# Patient Record
Sex: Female | Born: 1950
Health system: Southern US, Community
[De-identification: ages and names within clinical notes are randomized; demographics above are authoritative.]

## PROBLEM LIST (undated history)

## (undated) DIAGNOSIS — E7800 Pure hypercholesterolemia, unspecified: Secondary | ICD-10-CM

## (undated) DIAGNOSIS — E119 Type 2 diabetes mellitus without complications: Secondary | ICD-10-CM

---

## 1998-03-23 ENCOUNTER — Ambulatory Visit (HOSPITAL_COMMUNITY): Admission: RE | Admit: 1998-03-23 | Discharge: 1998-03-23 | Payer: Self-pay | Admitting: *Deleted

## 1998-06-28 ENCOUNTER — Ambulatory Visit (HOSPITAL_COMMUNITY): Admission: RE | Admit: 1998-06-28 | Discharge: 1998-06-28 | Payer: Self-pay | Admitting: Gastroenterology

## 1999-04-20 ENCOUNTER — Other Ambulatory Visit: Admission: RE | Admit: 1999-04-20 | Discharge: 1999-04-20 | Payer: Self-pay | Admitting: *Deleted

## 1999-06-02 ENCOUNTER — Ambulatory Visit (HOSPITAL_COMMUNITY): Admission: RE | Admit: 1999-06-02 | Discharge: 1999-06-02 | Payer: Self-pay | Admitting: *Deleted

## 1999-06-30 ENCOUNTER — Ambulatory Visit (HOSPITAL_COMMUNITY): Admission: RE | Admit: 1999-06-30 | Discharge: 1999-06-30 | Payer: Self-pay | Admitting: *Deleted

## 2000-10-31 ENCOUNTER — Encounter: Payer: Self-pay | Admitting: Internal Medicine

## 2000-10-31 ENCOUNTER — Ambulatory Visit (HOSPITAL_COMMUNITY): Admission: RE | Admit: 2000-10-31 | Discharge: 2000-10-31 | Payer: Self-pay | Admitting: Internal Medicine

## 2000-11-12 ENCOUNTER — Encounter: Admission: RE | Admit: 2000-11-12 | Discharge: 2001-02-10 | Payer: Self-pay | Admitting: Endocrinology

## 2000-11-22 ENCOUNTER — Ambulatory Visit (HOSPITAL_COMMUNITY): Admission: RE | Admit: 2000-11-22 | Discharge: 2000-11-22 | Payer: Self-pay | Admitting: Internal Medicine

## 2000-11-22 ENCOUNTER — Encounter: Payer: Self-pay | Admitting: Internal Medicine

## 2001-03-20 ENCOUNTER — Other Ambulatory Visit: Admission: RE | Admit: 2001-03-20 | Discharge: 2001-03-20 | Payer: Self-pay | Admitting: Internal Medicine

## 2002-12-31 ENCOUNTER — Ambulatory Visit (HOSPITAL_COMMUNITY): Admission: RE | Admit: 2002-12-31 | Discharge: 2002-12-31 | Payer: Self-pay | Admitting: Internal Medicine

## 2002-12-31 ENCOUNTER — Encounter: Payer: Self-pay | Admitting: Internal Medicine

## 2003-11-23 ENCOUNTER — Other Ambulatory Visit: Admission: RE | Admit: 2003-11-23 | Discharge: 2003-11-23 | Payer: Self-pay | Admitting: Obstetrics and Gynecology

## 2004-02-24 ENCOUNTER — Ambulatory Visit (HOSPITAL_COMMUNITY): Admission: RE | Admit: 2004-02-24 | Discharge: 2004-02-24 | Payer: Self-pay | Admitting: Obstetrics and Gynecology

## 2004-12-08 ENCOUNTER — Other Ambulatory Visit: Admission: RE | Admit: 2004-12-08 | Discharge: 2004-12-08 | Payer: Self-pay | Admitting: Obstetrics and Gynecology

## 2013-08-12 ENCOUNTER — Encounter (INDEPENDENT_AMBULATORY_CARE_PROVIDER_SITE_OTHER): Payer: Self-pay | Admitting: Ophthalmology

## 2013-08-19 ENCOUNTER — Encounter (INDEPENDENT_AMBULATORY_CARE_PROVIDER_SITE_OTHER): Payer: Self-pay | Admitting: Ophthalmology

## 2013-08-31 ENCOUNTER — Encounter (INDEPENDENT_AMBULATORY_CARE_PROVIDER_SITE_OTHER): Payer: BC Managed Care – PPO | Admitting: Ophthalmology

## 2013-08-31 DIAGNOSIS — E1165 Type 2 diabetes mellitus with hyperglycemia: Secondary | ICD-10-CM

## 2013-08-31 DIAGNOSIS — E1139 Type 2 diabetes mellitus with other diabetic ophthalmic complication: Secondary | ICD-10-CM

## 2013-08-31 DIAGNOSIS — E11319 Type 2 diabetes mellitus with unspecified diabetic retinopathy without macular edema: Secondary | ICD-10-CM

## 2013-08-31 DIAGNOSIS — H2510 Age-related nuclear cataract, unspecified eye: Secondary | ICD-10-CM

## 2013-08-31 DIAGNOSIS — H43819 Vitreous degeneration, unspecified eye: Secondary | ICD-10-CM

## 2013-09-14 ENCOUNTER — Other Ambulatory Visit (INDEPENDENT_AMBULATORY_CARE_PROVIDER_SITE_OTHER): Payer: BC Managed Care – PPO | Admitting: Ophthalmology

## 2013-09-14 DIAGNOSIS — E1139 Type 2 diabetes mellitus with other diabetic ophthalmic complication: Secondary | ICD-10-CM

## 2013-09-14 DIAGNOSIS — E1165 Type 2 diabetes mellitus with hyperglycemia: Secondary | ICD-10-CM

## 2013-09-14 DIAGNOSIS — H3581 Retinal edema: Secondary | ICD-10-CM

## 2014-01-12 ENCOUNTER — Ambulatory Visit (INDEPENDENT_AMBULATORY_CARE_PROVIDER_SITE_OTHER): Payer: BC Managed Care – PPO | Admitting: Ophthalmology

## 2014-02-09 DIAGNOSIS — E119 Type 2 diabetes mellitus without complications: Secondary | ICD-10-CM | POA: Diagnosis not present

## 2014-02-17 DIAGNOSIS — E789 Disorder of lipoprotein metabolism, unspecified: Secondary | ICD-10-CM | POA: Diagnosis not present

## 2014-02-17 DIAGNOSIS — I10 Essential (primary) hypertension: Secondary | ICD-10-CM | POA: Diagnosis not present

## 2014-02-17 DIAGNOSIS — E119 Type 2 diabetes mellitus without complications: Secondary | ICD-10-CM | POA: Diagnosis not present

## 2014-05-11 DIAGNOSIS — Z23 Encounter for immunization: Secondary | ICD-10-CM | POA: Diagnosis not present

## 2014-06-16 DIAGNOSIS — E789 Disorder of lipoprotein metabolism, unspecified: Secondary | ICD-10-CM | POA: Diagnosis not present

## 2014-06-16 DIAGNOSIS — I10 Essential (primary) hypertension: Secondary | ICD-10-CM | POA: Diagnosis not present

## 2014-06-16 DIAGNOSIS — Z79899 Other long term (current) drug therapy: Secondary | ICD-10-CM | POA: Diagnosis not present

## 2014-06-16 DIAGNOSIS — E118 Type 2 diabetes mellitus with unspecified complications: Secondary | ICD-10-CM | POA: Diagnosis not present

## 2014-06-17 DIAGNOSIS — E785 Hyperlipidemia, unspecified: Secondary | ICD-10-CM | POA: Diagnosis not present

## 2014-06-21 DIAGNOSIS — E118 Type 2 diabetes mellitus with unspecified complications: Secondary | ICD-10-CM | POA: Diagnosis not present

## 2014-06-21 DIAGNOSIS — I10 Essential (primary) hypertension: Secondary | ICD-10-CM | POA: Diagnosis not present

## 2014-07-05 DIAGNOSIS — L259 Unspecified contact dermatitis, unspecified cause: Secondary | ICD-10-CM | POA: Diagnosis not present

## 2014-10-12 DIAGNOSIS — E118 Type 2 diabetes mellitus with unspecified complications: Secondary | ICD-10-CM | POA: Diagnosis not present

## 2014-10-12 DIAGNOSIS — I10 Essential (primary) hypertension: Secondary | ICD-10-CM | POA: Diagnosis not present

## 2014-10-14 DIAGNOSIS — I10 Essential (primary) hypertension: Secondary | ICD-10-CM | POA: Diagnosis not present

## 2014-10-14 DIAGNOSIS — E789 Disorder of lipoprotein metabolism, unspecified: Secondary | ICD-10-CM | POA: Diagnosis not present

## 2014-10-14 DIAGNOSIS — E118 Type 2 diabetes mellitus with unspecified complications: Secondary | ICD-10-CM | POA: Diagnosis not present

## 2014-10-20 DIAGNOSIS — T50905A Adverse effect of unspecified drugs, medicaments and biological substances, initial encounter: Secondary | ICD-10-CM | POA: Diagnosis not present

## 2014-10-20 DIAGNOSIS — I10 Essential (primary) hypertension: Secondary | ICD-10-CM | POA: Diagnosis not present

## 2014-10-20 DIAGNOSIS — E118 Type 2 diabetes mellitus with unspecified complications: Secondary | ICD-10-CM | POA: Diagnosis not present

## 2014-10-20 DIAGNOSIS — L309 Dermatitis, unspecified: Secondary | ICD-10-CM | POA: Diagnosis not present

## 2014-11-16 DIAGNOSIS — J329 Chronic sinusitis, unspecified: Secondary | ICD-10-CM | POA: Diagnosis not present

## 2014-11-16 DIAGNOSIS — E789 Disorder of lipoprotein metabolism, unspecified: Secondary | ICD-10-CM | POA: Diagnosis not present

## 2014-11-16 DIAGNOSIS — J069 Acute upper respiratory infection, unspecified: Secondary | ICD-10-CM | POA: Diagnosis not present

## 2014-11-16 DIAGNOSIS — E118 Type 2 diabetes mellitus with unspecified complications: Secondary | ICD-10-CM | POA: Diagnosis not present

## 2014-12-14 ENCOUNTER — Encounter (INDEPENDENT_AMBULATORY_CARE_PROVIDER_SITE_OTHER): Payer: Medicare Other | Admitting: Ophthalmology

## 2014-12-14 DIAGNOSIS — H2513 Age-related nuclear cataract, bilateral: Secondary | ICD-10-CM | POA: Diagnosis not present

## 2014-12-14 DIAGNOSIS — H43813 Vitreous degeneration, bilateral: Secondary | ICD-10-CM

## 2014-12-14 DIAGNOSIS — E11359 Type 2 diabetes mellitus with proliferative diabetic retinopathy without macular edema: Secondary | ICD-10-CM

## 2014-12-14 DIAGNOSIS — E11311 Type 2 diabetes mellitus with unspecified diabetic retinopathy with macular edema: Secondary | ICD-10-CM

## 2014-12-14 DIAGNOSIS — E11351 Type 2 diabetes mellitus with proliferative diabetic retinopathy with macular edema: Secondary | ICD-10-CM | POA: Diagnosis not present

## 2015-03-01 DIAGNOSIS — E789 Disorder of lipoprotein metabolism, unspecified: Secondary | ICD-10-CM | POA: Diagnosis not present

## 2015-03-01 DIAGNOSIS — E118 Type 2 diabetes mellitus with unspecified complications: Secondary | ICD-10-CM | POA: Diagnosis not present

## 2015-03-09 DIAGNOSIS — E032 Hypothyroidism due to medicaments and other exogenous substances: Secondary | ICD-10-CM | POA: Diagnosis not present

## 2015-03-09 DIAGNOSIS — E118 Type 2 diabetes mellitus with unspecified complications: Secondary | ICD-10-CM | POA: Diagnosis not present

## 2015-03-09 DIAGNOSIS — I10 Essential (primary) hypertension: Secondary | ICD-10-CM | POA: Diagnosis not present

## 2015-03-09 DIAGNOSIS — M79643 Pain in unspecified hand: Secondary | ICD-10-CM | POA: Diagnosis not present

## 2015-04-04 DIAGNOSIS — Z23 Encounter for immunization: Secondary | ICD-10-CM | POA: Diagnosis not present

## 2015-06-17 ENCOUNTER — Ambulatory Visit (INDEPENDENT_AMBULATORY_CARE_PROVIDER_SITE_OTHER): Payer: Medicare Other | Admitting: Ophthalmology

## 2015-06-24 ENCOUNTER — Ambulatory Visit (INDEPENDENT_AMBULATORY_CARE_PROVIDER_SITE_OTHER): Payer: Medicare Other | Admitting: Ophthalmology

## 2015-06-24 DIAGNOSIS — H43813 Vitreous degeneration, bilateral: Secondary | ICD-10-CM

## 2015-06-24 DIAGNOSIS — H2513 Age-related nuclear cataract, bilateral: Secondary | ICD-10-CM | POA: Diagnosis not present

## 2015-06-24 DIAGNOSIS — E11319 Type 2 diabetes mellitus with unspecified diabetic retinopathy without macular edema: Secondary | ICD-10-CM | POA: Diagnosis not present

## 2015-06-24 DIAGNOSIS — E113593 Type 2 diabetes mellitus with proliferative diabetic retinopathy without macular edema, bilateral: Secondary | ICD-10-CM | POA: Diagnosis not present

## 2015-07-12 DIAGNOSIS — E118 Type 2 diabetes mellitus with unspecified complications: Secondary | ICD-10-CM | POA: Diagnosis not present

## 2015-07-12 DIAGNOSIS — E032 Hypothyroidism due to medicaments and other exogenous substances: Secondary | ICD-10-CM | POA: Diagnosis not present

## 2015-07-12 DIAGNOSIS — E7800 Pure hypercholesterolemia, unspecified: Secondary | ICD-10-CM | POA: Diagnosis not present

## 2015-07-25 DIAGNOSIS — M79674 Pain in right toe(s): Secondary | ICD-10-CM | POA: Diagnosis not present

## 2015-07-25 DIAGNOSIS — E118 Type 2 diabetes mellitus with unspecified complications: Secondary | ICD-10-CM | POA: Diagnosis not present

## 2015-07-25 DIAGNOSIS — I10 Essential (primary) hypertension: Secondary | ICD-10-CM | POA: Diagnosis not present

## 2015-07-25 DIAGNOSIS — E789 Disorder of lipoprotein metabolism, unspecified: Secondary | ICD-10-CM | POA: Diagnosis not present

## 2015-07-25 DIAGNOSIS — Z79899 Other long term (current) drug therapy: Secondary | ICD-10-CM | POA: Diagnosis not present

## 2015-07-25 DIAGNOSIS — M79676 Pain in unspecified toe(s): Secondary | ICD-10-CM | POA: Diagnosis not present

## 2015-10-10 DIAGNOSIS — R05 Cough: Secondary | ICD-10-CM | POA: Diagnosis not present

## 2015-10-10 DIAGNOSIS — J111 Influenza due to unidentified influenza virus with other respiratory manifestations: Secondary | ICD-10-CM | POA: Diagnosis not present

## 2015-11-15 DIAGNOSIS — E118 Type 2 diabetes mellitus with unspecified complications: Secondary | ICD-10-CM | POA: Diagnosis not present

## 2015-11-15 DIAGNOSIS — I10 Essential (primary) hypertension: Secondary | ICD-10-CM | POA: Diagnosis not present

## 2015-11-23 DIAGNOSIS — E118 Type 2 diabetes mellitus with unspecified complications: Secondary | ICD-10-CM | POA: Diagnosis not present

## 2015-12-22 ENCOUNTER — Ambulatory Visit (INDEPENDENT_AMBULATORY_CARE_PROVIDER_SITE_OTHER): Payer: Medicare Other | Admitting: Ophthalmology

## 2016-02-10 DIAGNOSIS — E118 Type 2 diabetes mellitus with unspecified complications: Secondary | ICD-10-CM | POA: Diagnosis not present

## 2016-02-10 DIAGNOSIS — I10 Essential (primary) hypertension: Secondary | ICD-10-CM | POA: Diagnosis not present

## 2016-02-15 ENCOUNTER — Ambulatory Visit (INDEPENDENT_AMBULATORY_CARE_PROVIDER_SITE_OTHER): Payer: Medicare Other | Admitting: Ophthalmology

## 2016-02-15 DIAGNOSIS — H2513 Age-related nuclear cataract, bilateral: Secondary | ICD-10-CM

## 2016-02-15 DIAGNOSIS — E113593 Type 2 diabetes mellitus with proliferative diabetic retinopathy without macular edema, bilateral: Secondary | ICD-10-CM

## 2016-02-15 DIAGNOSIS — H43813 Vitreous degeneration, bilateral: Secondary | ICD-10-CM | POA: Diagnosis not present

## 2016-02-15 DIAGNOSIS — E11319 Type 2 diabetes mellitus with unspecified diabetic retinopathy without macular edema: Secondary | ICD-10-CM

## 2016-02-20 DIAGNOSIS — M79675 Pain in left toe(s): Secondary | ICD-10-CM | POA: Diagnosis not present

## 2016-02-20 DIAGNOSIS — S92912A Unspecified fracture of left toe(s), initial encounter for closed fracture: Secondary | ICD-10-CM | POA: Diagnosis not present

## 2016-03-01 DIAGNOSIS — H8113 Benign paroxysmal vertigo, bilateral: Secondary | ICD-10-CM | POA: Diagnosis not present

## 2016-03-30 ENCOUNTER — Other Ambulatory Visit: Payer: Self-pay

## 2016-04-10 DIAGNOSIS — E118 Type 2 diabetes mellitus with unspecified complications: Secondary | ICD-10-CM | POA: Diagnosis not present

## 2016-04-10 DIAGNOSIS — Z23 Encounter for immunization: Secondary | ICD-10-CM | POA: Diagnosis not present

## 2016-04-10 DIAGNOSIS — E032 Hypothyroidism due to medicaments and other exogenous substances: Secondary | ICD-10-CM | POA: Diagnosis not present

## 2016-07-10 DIAGNOSIS — E118 Type 2 diabetes mellitus with unspecified complications: Secondary | ICD-10-CM | POA: Diagnosis not present

## 2016-07-10 DIAGNOSIS — E032 Hypothyroidism due to medicaments and other exogenous substances: Secondary | ICD-10-CM | POA: Diagnosis not present

## 2016-07-10 DIAGNOSIS — I10 Essential (primary) hypertension: Secondary | ICD-10-CM | POA: Diagnosis not present

## 2016-07-17 DIAGNOSIS — I10 Essential (primary) hypertension: Secondary | ICD-10-CM | POA: Diagnosis not present

## 2016-07-17 DIAGNOSIS — N390 Urinary tract infection, site not specified: Secondary | ICD-10-CM | POA: Diagnosis not present

## 2016-07-17 DIAGNOSIS — E118 Type 2 diabetes mellitus with unspecified complications: Secondary | ICD-10-CM | POA: Diagnosis not present

## 2016-07-17 DIAGNOSIS — J329 Chronic sinusitis, unspecified: Secondary | ICD-10-CM | POA: Diagnosis not present

## 2016-08-22 ENCOUNTER — Ambulatory Visit (INDEPENDENT_AMBULATORY_CARE_PROVIDER_SITE_OTHER): Payer: Medicare Other | Admitting: Ophthalmology

## 2016-09-17 ENCOUNTER — Ambulatory Visit (INDEPENDENT_AMBULATORY_CARE_PROVIDER_SITE_OTHER): Payer: Medicare Other | Admitting: Ophthalmology

## 2016-09-17 DIAGNOSIS — E11319 Type 2 diabetes mellitus with unspecified diabetic retinopathy without macular edema: Secondary | ICD-10-CM | POA: Diagnosis not present

## 2016-09-17 DIAGNOSIS — E113593 Type 2 diabetes mellitus with proliferative diabetic retinopathy without macular edema, bilateral: Secondary | ICD-10-CM | POA: Diagnosis not present

## 2016-09-17 DIAGNOSIS — H2513 Age-related nuclear cataract, bilateral: Secondary | ICD-10-CM | POA: Diagnosis not present

## 2016-09-17 DIAGNOSIS — H43813 Vitreous degeneration, bilateral: Secondary | ICD-10-CM | POA: Diagnosis not present

## 2016-10-09 DIAGNOSIS — I10 Essential (primary) hypertension: Secondary | ICD-10-CM | POA: Diagnosis not present

## 2016-10-09 DIAGNOSIS — E789 Disorder of lipoprotein metabolism, unspecified: Secondary | ICD-10-CM | POA: Diagnosis not present

## 2016-10-09 DIAGNOSIS — E118 Type 2 diabetes mellitus with unspecified complications: Secondary | ICD-10-CM | POA: Diagnosis not present

## 2016-10-16 DIAGNOSIS — I10 Essential (primary) hypertension: Secondary | ICD-10-CM | POA: Diagnosis not present

## 2016-10-16 DIAGNOSIS — E118 Type 2 diabetes mellitus with unspecified complications: Secondary | ICD-10-CM | POA: Diagnosis not present

## 2016-11-29 DIAGNOSIS — H31013 Macula scars of posterior pole (postinflammatory) (post-traumatic), bilateral: Secondary | ICD-10-CM | POA: Diagnosis not present

## 2016-11-29 DIAGNOSIS — E113593 Type 2 diabetes mellitus with proliferative diabetic retinopathy without macular edema, bilateral: Secondary | ICD-10-CM | POA: Diagnosis not present

## 2016-11-29 DIAGNOSIS — H2513 Age-related nuclear cataract, bilateral: Secondary | ICD-10-CM | POA: Diagnosis not present

## 2016-11-29 DIAGNOSIS — H2512 Age-related nuclear cataract, left eye: Secondary | ICD-10-CM | POA: Diagnosis not present

## 2016-11-29 DIAGNOSIS — H25013 Cortical age-related cataract, bilateral: Secondary | ICD-10-CM | POA: Diagnosis not present

## 2017-01-01 DIAGNOSIS — H25012 Cortical age-related cataract, left eye: Secondary | ICD-10-CM | POA: Diagnosis not present

## 2017-01-01 DIAGNOSIS — H2512 Age-related nuclear cataract, left eye: Secondary | ICD-10-CM | POA: Diagnosis not present

## 2017-01-01 DIAGNOSIS — H25032 Anterior subcapsular polar age-related cataract, left eye: Secondary | ICD-10-CM | POA: Diagnosis not present

## 2017-01-10 DIAGNOSIS — E789 Disorder of lipoprotein metabolism, unspecified: Secondary | ICD-10-CM | POA: Diagnosis not present

## 2017-01-10 DIAGNOSIS — E032 Hypothyroidism due to medicaments and other exogenous substances: Secondary | ICD-10-CM | POA: Diagnosis not present

## 2017-01-10 DIAGNOSIS — Z79899 Other long term (current) drug therapy: Secondary | ICD-10-CM | POA: Diagnosis not present

## 2017-01-10 DIAGNOSIS — E118 Type 2 diabetes mellitus with unspecified complications: Secondary | ICD-10-CM | POA: Diagnosis not present

## 2017-01-10 DIAGNOSIS — I10 Essential (primary) hypertension: Secondary | ICD-10-CM | POA: Diagnosis not present

## 2017-01-17 DIAGNOSIS — E789 Disorder of lipoprotein metabolism, unspecified: Secondary | ICD-10-CM | POA: Diagnosis not present

## 2017-01-17 DIAGNOSIS — E118 Type 2 diabetes mellitus with unspecified complications: Secondary | ICD-10-CM | POA: Diagnosis not present

## 2017-01-17 DIAGNOSIS — I10 Essential (primary) hypertension: Secondary | ICD-10-CM | POA: Diagnosis not present

## 2017-01-17 DIAGNOSIS — M1990 Unspecified osteoarthritis, unspecified site: Secondary | ICD-10-CM | POA: Diagnosis not present

## 2017-01-23 DIAGNOSIS — H2511 Age-related nuclear cataract, right eye: Secondary | ICD-10-CM | POA: Diagnosis not present

## 2017-01-23 DIAGNOSIS — H25011 Cortical age-related cataract, right eye: Secondary | ICD-10-CM | POA: Diagnosis not present

## 2017-01-23 DIAGNOSIS — H25041 Posterior subcapsular polar age-related cataract, right eye: Secondary | ICD-10-CM | POA: Diagnosis not present

## 2017-03-21 ENCOUNTER — Ambulatory Visit (INDEPENDENT_AMBULATORY_CARE_PROVIDER_SITE_OTHER): Payer: Medicare Other | Admitting: Ophthalmology

## 2017-03-21 DIAGNOSIS — E11319 Type 2 diabetes mellitus with unspecified diabetic retinopathy without macular edema: Secondary | ICD-10-CM | POA: Diagnosis not present

## 2017-03-21 DIAGNOSIS — H2511 Age-related nuclear cataract, right eye: Secondary | ICD-10-CM

## 2017-03-21 DIAGNOSIS — E113593 Type 2 diabetes mellitus with proliferative diabetic retinopathy without macular edema, bilateral: Secondary | ICD-10-CM | POA: Diagnosis not present

## 2017-03-21 DIAGNOSIS — H43813 Vitreous degeneration, bilateral: Secondary | ICD-10-CM | POA: Diagnosis not present

## 2017-04-16 DIAGNOSIS — H25811 Combined forms of age-related cataract, right eye: Secondary | ICD-10-CM | POA: Diagnosis not present

## 2017-04-16 DIAGNOSIS — H2511 Age-related nuclear cataract, right eye: Secondary | ICD-10-CM | POA: Diagnosis not present

## 2017-05-10 DIAGNOSIS — E118 Type 2 diabetes mellitus with unspecified complications: Secondary | ICD-10-CM | POA: Diagnosis not present

## 2017-05-20 DIAGNOSIS — Z23 Encounter for immunization: Secondary | ICD-10-CM | POA: Diagnosis not present

## 2017-05-20 DIAGNOSIS — E118 Type 2 diabetes mellitus with unspecified complications: Secondary | ICD-10-CM | POA: Diagnosis not present

## 2017-05-20 DIAGNOSIS — E11319 Type 2 diabetes mellitus with unspecified diabetic retinopathy without macular edema: Secondary | ICD-10-CM | POA: Diagnosis not present

## 2017-05-20 DIAGNOSIS — E789 Disorder of lipoprotein metabolism, unspecified: Secondary | ICD-10-CM | POA: Diagnosis not present

## 2017-05-20 DIAGNOSIS — I10 Essential (primary) hypertension: Secondary | ICD-10-CM | POA: Diagnosis not present

## 2017-07-31 DIAGNOSIS — Z0000 Encounter for general adult medical examination without abnormal findings: Secondary | ICD-10-CM | POA: Diagnosis not present

## 2017-08-21 DIAGNOSIS — E789 Disorder of lipoprotein metabolism, unspecified: Secondary | ICD-10-CM | POA: Diagnosis not present

## 2017-08-21 DIAGNOSIS — I10 Essential (primary) hypertension: Secondary | ICD-10-CM | POA: Diagnosis not present

## 2017-08-21 DIAGNOSIS — E118 Type 2 diabetes mellitus with unspecified complications: Secondary | ICD-10-CM | POA: Diagnosis not present

## 2017-08-26 DIAGNOSIS — E782 Mixed hyperlipidemia: Secondary | ICD-10-CM | POA: Diagnosis not present

## 2017-08-26 DIAGNOSIS — I10 Essential (primary) hypertension: Secondary | ICD-10-CM | POA: Diagnosis not present

## 2017-08-26 DIAGNOSIS — E118 Type 2 diabetes mellitus with unspecified complications: Secondary | ICD-10-CM | POA: Diagnosis not present

## 2017-08-26 DIAGNOSIS — E11319 Type 2 diabetes mellitus with unspecified diabetic retinopathy without macular edema: Secondary | ICD-10-CM | POA: Diagnosis not present

## 2017-09-17 DIAGNOSIS — Z1211 Encounter for screening for malignant neoplasm of colon: Secondary | ICD-10-CM | POA: Diagnosis not present

## 2017-09-17 DIAGNOSIS — Z1212 Encounter for screening for malignant neoplasm of rectum: Secondary | ICD-10-CM | POA: Diagnosis not present

## 2017-09-19 ENCOUNTER — Encounter (INDEPENDENT_AMBULATORY_CARE_PROVIDER_SITE_OTHER): Payer: Medicare Other | Admitting: Ophthalmology

## 2018-02-24 DIAGNOSIS — N390 Urinary tract infection, site not specified: Secondary | ICD-10-CM | POA: Diagnosis not present

## 2018-02-24 DIAGNOSIS — E782 Mixed hyperlipidemia: Secondary | ICD-10-CM | POA: Diagnosis not present

## 2018-02-24 DIAGNOSIS — E118 Type 2 diabetes mellitus with unspecified complications: Secondary | ICD-10-CM | POA: Diagnosis not present

## 2018-02-24 DIAGNOSIS — Z23 Encounter for immunization: Secondary | ICD-10-CM | POA: Diagnosis not present

## 2018-02-24 DIAGNOSIS — Z0000 Encounter for general adult medical examination without abnormal findings: Secondary | ICD-10-CM | POA: Diagnosis not present

## 2018-02-24 DIAGNOSIS — I10 Essential (primary) hypertension: Secondary | ICD-10-CM | POA: Diagnosis not present

## 2018-03-03 DIAGNOSIS — E782 Mixed hyperlipidemia: Secondary | ICD-10-CM | POA: Diagnosis not present

## 2018-03-03 DIAGNOSIS — I10 Essential (primary) hypertension: Secondary | ICD-10-CM | POA: Diagnosis not present

## 2018-03-03 DIAGNOSIS — E11319 Type 2 diabetes mellitus with unspecified diabetic retinopathy without macular edema: Secondary | ICD-10-CM | POA: Diagnosis not present

## 2018-03-03 DIAGNOSIS — E118 Type 2 diabetes mellitus with unspecified complications: Secondary | ICD-10-CM | POA: Diagnosis not present

## 2018-04-02 DIAGNOSIS — Z23 Encounter for immunization: Secondary | ICD-10-CM | POA: Diagnosis not present

## 2018-06-02 DIAGNOSIS — E118 Type 2 diabetes mellitus with unspecified complications: Secondary | ICD-10-CM | POA: Diagnosis not present

## 2018-06-02 DIAGNOSIS — E782 Mixed hyperlipidemia: Secondary | ICD-10-CM | POA: Diagnosis not present

## 2018-06-09 DIAGNOSIS — E782 Mixed hyperlipidemia: Secondary | ICD-10-CM | POA: Diagnosis not present

## 2018-06-09 DIAGNOSIS — E11319 Type 2 diabetes mellitus with unspecified diabetic retinopathy without macular edema: Secondary | ICD-10-CM | POA: Diagnosis not present

## 2018-06-09 DIAGNOSIS — I10 Essential (primary) hypertension: Secondary | ICD-10-CM | POA: Diagnosis not present

## 2018-06-09 DIAGNOSIS — E118 Type 2 diabetes mellitus with unspecified complications: Secondary | ICD-10-CM | POA: Diagnosis not present

## 2018-09-03 DIAGNOSIS — E118 Type 2 diabetes mellitus with unspecified complications: Secondary | ICD-10-CM | POA: Diagnosis not present

## 2018-09-03 DIAGNOSIS — E782 Mixed hyperlipidemia: Secondary | ICD-10-CM | POA: Diagnosis not present

## 2018-09-08 DIAGNOSIS — J0110 Acute frontal sinusitis, unspecified: Secondary | ICD-10-CM | POA: Diagnosis not present

## 2018-09-08 DIAGNOSIS — E782 Mixed hyperlipidemia: Secondary | ICD-10-CM | POA: Diagnosis not present

## 2018-09-08 DIAGNOSIS — E118 Type 2 diabetes mellitus with unspecified complications: Secondary | ICD-10-CM | POA: Diagnosis not present

## 2018-09-08 DIAGNOSIS — J0100 Acute maxillary sinusitis, unspecified: Secondary | ICD-10-CM | POA: Diagnosis not present

## 2018-09-08 DIAGNOSIS — I10 Essential (primary) hypertension: Secondary | ICD-10-CM | POA: Diagnosis not present

## 2018-09-22 DIAGNOSIS — E11319 Type 2 diabetes mellitus with unspecified diabetic retinopathy without macular edema: Secondary | ICD-10-CM | POA: Diagnosis not present

## 2018-09-22 DIAGNOSIS — I10 Essential (primary) hypertension: Secondary | ICD-10-CM | POA: Diagnosis not present

## 2018-09-22 DIAGNOSIS — E782 Mixed hyperlipidemia: Secondary | ICD-10-CM | POA: Diagnosis not present

## 2018-09-22 DIAGNOSIS — J0100 Acute maxillary sinusitis, unspecified: Secondary | ICD-10-CM | POA: Diagnosis not present

## 2018-09-22 DIAGNOSIS — E118 Type 2 diabetes mellitus with unspecified complications: Secondary | ICD-10-CM | POA: Diagnosis not present

## 2018-10-07 DIAGNOSIS — E118 Type 2 diabetes mellitus with unspecified complications: Secondary | ICD-10-CM | POA: Diagnosis not present

## 2018-10-07 DIAGNOSIS — I10 Essential (primary) hypertension: Secondary | ICD-10-CM | POA: Diagnosis not present

## 2018-10-15 DIAGNOSIS — S0181XA Laceration without foreign body of other part of head, initial encounter: Secondary | ICD-10-CM | POA: Diagnosis not present

## 2018-10-15 DIAGNOSIS — S00531A Contusion of lip, initial encounter: Secondary | ICD-10-CM | POA: Diagnosis not present

## 2018-10-15 DIAGNOSIS — T148XXA Other injury of unspecified body region, initial encounter: Secondary | ICD-10-CM | POA: Diagnosis not present

## 2018-10-28 DIAGNOSIS — E118 Type 2 diabetes mellitus with unspecified complications: Secondary | ICD-10-CM | POA: Diagnosis not present

## 2019-01-12 DIAGNOSIS — I10 Essential (primary) hypertension: Secondary | ICD-10-CM | POA: Diagnosis not present

## 2019-01-12 DIAGNOSIS — Z0000 Encounter for general adult medical examination without abnormal findings: Secondary | ICD-10-CM | POA: Diagnosis not present

## 2019-01-12 DIAGNOSIS — E11319 Type 2 diabetes mellitus with unspecified diabetic retinopathy without macular edema: Secondary | ICD-10-CM | POA: Diagnosis not present

## 2019-01-12 DIAGNOSIS — E782 Mixed hyperlipidemia: Secondary | ICD-10-CM | POA: Diagnosis not present

## 2019-01-12 DIAGNOSIS — E118 Type 2 diabetes mellitus with unspecified complications: Secondary | ICD-10-CM | POA: Diagnosis not present

## 2019-03-09 DIAGNOSIS — E118 Type 2 diabetes mellitus with unspecified complications: Secondary | ICD-10-CM | POA: Diagnosis not present

## 2019-03-09 DIAGNOSIS — D509 Iron deficiency anemia, unspecified: Secondary | ICD-10-CM | POA: Diagnosis not present

## 2019-03-09 DIAGNOSIS — Z0000 Encounter for general adult medical examination without abnormal findings: Secondary | ICD-10-CM | POA: Diagnosis not present

## 2019-03-09 DIAGNOSIS — Z7189 Other specified counseling: Secondary | ICD-10-CM | POA: Diagnosis not present

## 2019-03-09 DIAGNOSIS — E782 Mixed hyperlipidemia: Secondary | ICD-10-CM | POA: Diagnosis not present

## 2019-03-09 DIAGNOSIS — I10 Essential (primary) hypertension: Secondary | ICD-10-CM | POA: Diagnosis not present

## 2019-03-09 DIAGNOSIS — Z23 Encounter for immunization: Secondary | ICD-10-CM | POA: Diagnosis not present

## 2019-03-16 DIAGNOSIS — Z794 Long term (current) use of insulin: Secondary | ICD-10-CM | POA: Diagnosis not present

## 2019-03-16 DIAGNOSIS — E11319 Type 2 diabetes mellitus with unspecified diabetic retinopathy without macular edema: Secondary | ICD-10-CM | POA: Diagnosis not present

## 2019-03-16 DIAGNOSIS — I10 Essential (primary) hypertension: Secondary | ICD-10-CM | POA: Diagnosis not present

## 2019-03-16 DIAGNOSIS — Z7189 Other specified counseling: Secondary | ICD-10-CM | POA: Diagnosis not present

## 2019-03-16 DIAGNOSIS — E782 Mixed hyperlipidemia: Secondary | ICD-10-CM | POA: Diagnosis not present

## 2019-03-16 DIAGNOSIS — E118 Type 2 diabetes mellitus with unspecified complications: Secondary | ICD-10-CM | POA: Diagnosis not present

## 2019-03-16 DIAGNOSIS — E1169 Type 2 diabetes mellitus with other specified complication: Secondary | ICD-10-CM | POA: Diagnosis not present

## 2019-04-08 DIAGNOSIS — Z23 Encounter for immunization: Secondary | ICD-10-CM | POA: Diagnosis not present

## 2019-05-09 ENCOUNTER — Emergency Department (HOSPITAL_COMMUNITY): Payer: Medicare Other

## 2019-05-09 ENCOUNTER — Other Ambulatory Visit: Payer: Self-pay

## 2019-05-09 ENCOUNTER — Observation Stay (HOSPITAL_COMMUNITY)
Admission: EM | Admit: 2019-05-09 | Discharge: 2019-05-10 | Disposition: A | Discharge status: Home / Self Care | Payer: Medicare Other | Attending: Internal Medicine | Admitting: Internal Medicine

## 2019-05-09 ENCOUNTER — Encounter (HOSPITAL_COMMUNITY): Payer: Self-pay

## 2019-05-09 ENCOUNTER — Observation Stay (HOSPITAL_COMMUNITY): Payer: Medicare Other

## 2019-05-09 DIAGNOSIS — I4510 Unspecified right bundle-branch block: Secondary | ICD-10-CM | POA: Insufficient documentation

## 2019-05-09 DIAGNOSIS — R2689 Other abnormalities of gait and mobility: Secondary | ICD-10-CM | POA: Diagnosis not present

## 2019-05-09 DIAGNOSIS — I6381 Other cerebral infarction due to occlusion or stenosis of small artery: Secondary | ICD-10-CM | POA: Diagnosis not present

## 2019-05-09 DIAGNOSIS — G459 Transient cerebral ischemic attack, unspecified: Secondary | ICD-10-CM

## 2019-05-09 DIAGNOSIS — I700 Atherosclerosis of aorta: Secondary | ICD-10-CM | POA: Diagnosis not present

## 2019-05-09 DIAGNOSIS — I639 Cerebral infarction, unspecified: Secondary | ICD-10-CM | POA: Diagnosis not present

## 2019-05-09 DIAGNOSIS — R270 Ataxia, unspecified: Secondary | ICD-10-CM | POA: Insufficient documentation

## 2019-05-09 DIAGNOSIS — R42 Dizziness and giddiness: Secondary | ICD-10-CM | POA: Diagnosis not present

## 2019-05-09 DIAGNOSIS — E119 Type 2 diabetes mellitus without complications: Secondary | ICD-10-CM | POA: Diagnosis not present

## 2019-05-09 DIAGNOSIS — D649 Anemia, unspecified: Secondary | ICD-10-CM | POA: Insufficient documentation

## 2019-05-09 DIAGNOSIS — Z20828 Contact with and (suspected) exposure to other viral communicable diseases: Secondary | ICD-10-CM | POA: Insufficient documentation

## 2019-05-09 DIAGNOSIS — Z794 Long term (current) use of insulin: Secondary | ICD-10-CM | POA: Insufficient documentation

## 2019-05-09 DIAGNOSIS — Z7902 Long term (current) use of antithrombotics/antiplatelets: Secondary | ICD-10-CM | POA: Insufficient documentation

## 2019-05-09 DIAGNOSIS — I499 Cardiac arrhythmia, unspecified: Secondary | ICD-10-CM | POA: Diagnosis not present

## 2019-05-09 DIAGNOSIS — Z79899 Other long term (current) drug therapy: Secondary | ICD-10-CM | POA: Diagnosis not present

## 2019-05-09 DIAGNOSIS — R29810 Facial weakness: Secondary | ICD-10-CM | POA: Diagnosis not present

## 2019-05-09 DIAGNOSIS — R531 Weakness: Secondary | ICD-10-CM | POA: Diagnosis not present

## 2019-05-09 DIAGNOSIS — I10 Essential (primary) hypertension: Secondary | ICD-10-CM | POA: Diagnosis not present

## 2019-05-09 DIAGNOSIS — R112 Nausea with vomiting, unspecified: Secondary | ICD-10-CM | POA: Diagnosis not present

## 2019-05-09 DIAGNOSIS — E538 Deficiency of other specified B group vitamins: Secondary | ICD-10-CM | POA: Diagnosis not present

## 2019-05-09 DIAGNOSIS — E785 Hyperlipidemia, unspecified: Secondary | ICD-10-CM | POA: Insufficient documentation

## 2019-05-09 DIAGNOSIS — E7800 Pure hypercholesterolemia, unspecified: Secondary | ICD-10-CM | POA: Diagnosis not present

## 2019-05-09 HISTORY — DX: Type 2 diabetes mellitus without complications: E11.9

## 2019-05-09 HISTORY — DX: Pure hypercholesterolemia, unspecified: E78.00

## 2019-05-09 LAB — URINALYSIS, ROUTINE W REFLEX MICROSCOPIC
Bacteria, UA: NONE SEEN
Bilirubin Urine: NEGATIVE
Glucose, UA: >=500 mg/dL — AB
Hgb urine dipstick: NEGATIVE
Ketones, ur: 5 mg/dL — AB
Leukocytes,Ua: NEGATIVE
Nitrite: NEGATIVE
Protein, ur: NEGATIVE mg/dL
Specific Gravity, Urine: 1.01 (ref 1.005–1.030)
pH: 7 (ref 5.0–8.0)

## 2019-05-09 LAB — COMPREHENSIVE METABOLIC PANEL
ALT: 29 U/L (ref 0–44)
AST: 43 U/L — ABNORMAL HIGH (ref 15–41)
Albumin: 3.5 g/dL (ref 3.5–5.0)
Alkaline Phosphatase: 160 U/L — ABNORMAL HIGH (ref 38–126)
Anion gap: 11 (ref 5–15)
BUN: 14 mg/dL (ref 8–23)
CO2: 25 mmol/L (ref 22–32)
Calcium: 9.2 mg/dL (ref 8.9–10.3)
Chloride: 103 mmol/L (ref 98–111)
Creatinine, Ser: 0.82 mg/dL (ref 0.44–1.00)
GFR calc Af Amer: >60 mL/min (ref >60)
GFR calc non Af Amer: >60 mL/min (ref >60)
Glucose, Bld: 128 mg/dL — ABNORMAL HIGH (ref 70–99)
Potassium: 4.2 mmol/L (ref 3.5–5.1)
Sodium: 139 mmol/L (ref 135–145)
Total Bilirubin: 0.6 mg/dL (ref 0.3–1.2)
Total Protein: 7.8 g/dL (ref 6.5–8.1)

## 2019-05-09 LAB — CBC WITH DIFFERENTIAL/PLATELET
Abs Immature Granulocytes: 0.02 10*3/uL (ref 0.00–0.07)
Basophils Absolute: 0.1 10*3/uL (ref 0.0–0.1)
Basophils Relative: 1 %
Eosinophils Absolute: 0.1 10*3/uL (ref 0.0–0.5)
Eosinophils Relative: 1 %
HCT: 37.4 % (ref 36.0–46.0)
Hemoglobin: 11.5 g/dL — ABNORMAL LOW (ref 12.0–15.0)
Immature Granulocytes: 0 %
Lymphocytes Relative: 13 %
Lymphs Abs: 1.1 10*3/uL (ref 0.7–4.0)
MCH: 24.8 pg — ABNORMAL LOW (ref 26.0–34.0)
MCHC: 30.7 g/dL (ref 30.0–36.0)
MCV: 80.8 fL (ref 80.0–100.0)
Monocytes Absolute: 0.3 10*3/uL (ref 0.1–1.0)
Monocytes Relative: 3 %
Neutro Abs: 7.3 10*3/uL (ref 1.7–7.7)
Neutrophils Relative %: 82 %
Platelets: 221 10*3/uL (ref 150–400)
RBC: 4.63 MIL/uL (ref 3.87–5.11)
RDW: 18.7 % — ABNORMAL HIGH (ref 11.5–15.5)
WBC: 8.8 10*3/uL (ref 4.0–10.5)
nRBC: 0 % (ref 0.0–0.2)

## 2019-05-09 LAB — LIPASE, BLOOD: Lipase: 39 U/L (ref 11–51)

## 2019-05-09 LAB — TROPONIN I (HIGH SENSITIVITY)
Troponin I (High Sensitivity): 7 ng/L (ref <18)
Troponin I (High Sensitivity): 8 ng/L (ref <18)

## 2019-05-09 LAB — SARS CORONAVIRUS 2 BY RT PCR (HOSPITAL ORDER, PERFORMED IN ~~LOC~~ HOSPITAL LAB): SARS Coronavirus 2: NEGATIVE

## 2019-05-09 MED ORDER — IOHEXOL 350 MG/ML SOLN
75.0000 mL | Freq: Once | INTRAVENOUS | Status: AC | PRN
  Administered 2019-05-09: 23:00:00 75 mL via INTRAVENOUS

## 2019-05-09 MED ORDER — ATORVASTATIN CALCIUM 40 MG PO TABS
40.0000 mg | ORAL_TABLET | Freq: Every day | ORAL | Status: DC
  Administered 2019-05-09: 40 mg via ORAL
  Filled 2019-05-09: qty 1

## 2019-05-09 MED ORDER — ASPIRIN 300 MG RE SUPP: 300.0000 mg | Freq: Every day | RECTAL | Status: DC

## 2019-05-09 MED ORDER — CLOPIDOGREL BISULFATE 75 MG PO TABS
75.0000 mg | ORAL_TABLET | Freq: Every day | ORAL | Status: DC
  Administered 2019-05-10: 09:00:00 75 mg via ORAL
  Filled 2019-05-09: qty 1

## 2019-05-09 MED ORDER — SODIUM CHLORIDE 0.9 % IV BOLUS
1000.0000 mL | Freq: Once | INTRAVENOUS | Status: AC
  Administered 2019-05-09: 1000 mL via INTRAVENOUS

## 2019-05-09 MED ORDER — STROKE: EARLY STAGES OF RECOVERY BOOK
Freq: Once | Status: AC
  Administered 2019-05-10: 08:00:00

## 2019-05-09 MED ORDER — ACETAMINOPHEN 325 MG PO TABS: 650.0000 mg | ORAL_TABLET | ORAL | Status: DC | PRN

## 2019-05-09 MED ORDER — INSULIN ASPART 100 UNIT/ML ~~LOC~~ SOLN: 0.0000 [IU] | Freq: Every day | SUBCUTANEOUS | Status: DC

## 2019-05-09 MED ORDER — ASPIRIN 325 MG PO TABS
325.0000 mg | ORAL_TABLET | Freq: Every day | ORAL | Status: DC
  Administered 2019-05-09: 325 mg via ORAL
  Filled 2019-05-09: qty 1

## 2019-05-09 MED ORDER — INSULIN ASPART 100 UNIT/ML ~~LOC~~ SOLN
0.0000 [IU] | Freq: Three times a day (TID) | SUBCUTANEOUS | Status: DC
  Administered 2019-05-10: 12:00:00 5 [IU] via SUBCUTANEOUS

## 2019-05-09 MED ORDER — ACETAMINOPHEN 160 MG/5ML PO SOLN: 650.0000 mg | ORAL | Status: DC | PRN

## 2019-05-09 MED ORDER — FAMOTIDINE IN NACL 20-0.9 MG/50ML-% IV SOLN
20.0000 mg | Freq: Once | INTRAVENOUS | Status: AC
  Administered 2019-05-09: 20 mg via INTRAVENOUS
  Filled 2019-05-09: qty 50

## 2019-05-09 MED ORDER — ENOXAPARIN SODIUM 40 MG/0.4ML ~~LOC~~ SOLN: 40.0000 mg | SUBCUTANEOUS | Status: DC

## 2019-05-09 MED ORDER — ASPIRIN EC 81 MG PO TBEC
81.0000 mg | DELAYED_RELEASE_TABLET | Freq: Every day | ORAL | Status: DC
  Administered 2019-05-10: 81 mg via ORAL
  Filled 2019-05-09: qty 1

## 2019-05-09 MED ORDER — ACETAMINOPHEN 650 MG RE SUPP: 650.0000 mg | RECTAL | Status: DC | PRN

## 2019-05-09 MED ORDER — SENNOSIDES-DOCUSATE SODIUM 8.6-50 MG PO TABS: 1.0000 | ORAL_TABLET | Freq: Every evening | ORAL | Status: DC | PRN

## 2019-05-09 MED ORDER — ONDANSETRON HCL 4 MG/2ML IJ SOLN
4.0000 mg | Freq: Once | INTRAMUSCULAR | Status: AC
  Administered 2019-05-09: 4 mg via INTRAVENOUS
  Filled 2019-05-09: qty 2

## 2019-05-09 NOTE — ED Notes (Signed)
Assisted pt to restroom. Pt's gait remains mildly unstable.

## 2019-05-09 NOTE — ED Notes (Signed)
Patient transported to MRI 

## 2019-05-09 NOTE — ED Provider Notes (Signed)
4:11 PM Care assumed from Dr. Vanita Panda.  At time of transfer care, patient is waiting results of MRI to look for etiology of her transient dizziness and gait instability.  Plan of care will be to discharge patient for outpatient neurology follow-up and TIA work-up if MRI is reassuring however if there is acute stroke, will call neurology for likely admission.  8:33 PM MRI initially showed some abnormalities that were other artifact versus acute stroke in an area of an old stroke.  Neurology recommended a second MRI to further evaluate.  Second MRI did not show acute stroke however neurology can see the patient and think she has an acute stroke not seen on MRI.  They request patient be admitted for further evaluation and management.  Clinical Impression: 1. Dizziness     Disposition: Admit  This note was prepared with assistance of Dragon voice recognition software. Occasional wrong-word or sound-a-like substitutions may have occurred due to the inherent limitations of voice recognition software.      Angela Salinas, Gwenyth Allegra, MD 05/10/19 [phone]

## 2019-05-09 NOTE — Consult Note (Signed)
Requesting Physician: Dr. Sherry Ruffing    Chief Complaint: Sudden onset gait imbalance, nausea  History obtained from: Patient and Chart  HPI:                                                                                                                                       Angela Salinas is a 68 y.o. female with past medical history of diabetes mellitus, hyperlipidemia, hypertension who presents to the emergency department for sudden onset unsteady gait and nausea/vomiting.  She was last known normal at 8 PM when she went to bed.  She woke up at 2 AM and decided she would charge her phone and when she got up noticed her gait seemed to be off.  She went back to bed and woke up at 4:30 in the morning and felt severely nauseous and had vomiting.  The morning she called her son who is a physician who recommended her to go to the emergency room.  She denies sensation of room spinning, denies double vision or slurred speech.  Denies any sensory symptoms or motor weakness.  In the ER, symptoms have improved slightly but continues to have some mild gait imbalance.  CT head was unremarkable and MRI brain was performed.  MRI brain initially showed 2 punctate areas of DWI which was thought to be signal mellitus and MRI brain with thin cuts in the posterior circulation was repeated which was negative.  Date last known well: 10.2.20 Time last known well: 8 PM tPA Given: No, outside TPA window NIHSS: 0 Baseline MRS 0   Past Medical History:  Diagnosis Date  . Diabetes mellitus without complication ([place])   . High cholesterol       No family history on file.   Social History:  reports that she has never smoked. She has never used smokeless tobacco. She reports that she does not drink alcohol or use drugs.  Allergies:  Allergies  Allergen Reactions  . Codeine Other (See Comments)    lightheaded  . Sulfa Antibiotics Other (See Comments)    Cannot recall    Medications:                                                                                                                         I reviewed home medications   ROS:  14 systems reviewed and negative except above    Examination:                                                                                                      General: Appears well-developed  Psych: Affect appropriate to situation Eyes: No scleral injection HENT: No OP obstrucion Head: Normocephalic.  Cardiovascular: Normal rate and regular rhythm. Respiratory: Effort normal and breath sounds normal to anterior ascultation GI: Soft.  No distension. There is no tenderness.  Skin: WDI    Neurological Examination Mental Status: Alert, oriented, thought content appropriate.  Speech fluent without evidence of aphasia. Able to follow 3 step commands without difficulty. Cranial Nerves: II: Visual fields grossly normal,  III,IV, VI: ptosis not present, extra-ocular motions intact bilaterally, pupils equal, round, reactive to light and accommodation V,VII: smile symmetric, facial light touch sensation normal bilaterally VIII: hearing normal bilaterally IX,X: uvula rises symmetrically XI: bilateral shoulder shrug XII: midline tongue extension Motor: Right : Upper extremity   5/5    Left:     Upper extremity   5/5  Lower extremity   5/5     Lower extremity   5/5 Tone and bulk:normal tone throughout; no atrophy noted Sensory: Pinprick and light touch intact throughout, bilaterally Deep Tendon Reflexes: 2+ and symmetric throughout Plantars: Right: downgoing   Left: downgoing Cerebellar: normal finger-to-nose, normal rapid alternating movements and normal heel-to-shin test Gait: Mild truncal ataxia   Lab Results: Basic Metabolic Panel: Recent Labs  Lab 05/09/19 0943  NA 139  K 4.2  CL 103  CO2 25  GLUCOSE 128*   BUN 14  CREATININE 0.82  CALCIUM 9.2    CBC: Recent Labs  Lab 05/09/19 0943  WBC 8.8  NEUTROABS 7.3  HGB 11.5*  HCT 37.4  MCV 80.8  PLT 221    Coagulation Studies: No results for input(s): LABPROT, INR in the last 72 hours.  Imaging: Mr Brain Wo Contrast  Result Date: 05/09/2019 CLINICAL DATA:  Ataxia, stroke suspected. EXAM: MRI HEAD WITHOUT CONTRAST TECHNIQUE: Multiplanar, multiecho pulse sequences of the brain and surrounding structures were obtained without intravenous contrast. COMPARISON:  Brain MRI performed earlier the same day 05/09/2019 FINDINGS: Please note only thin section axial, coronal and sagittal diffusion-weighted imaging was performed to assess for possible acute infarct within the pons. No evidence of acute infarct within the pons. No restricted diffusion demonstrated elsewhere within the visualized brain. IMPRESSION: Thin-section axial, coronal and sagittal diffusion-weighted imaging performed through the region of the brainstem. No evidence of acute infarct within the pons. Electronically Signed   By: Kellie Simmering   On: 05/09/2019 19:25   Mr Brain Wo Contrast (neuro Protocol)  Result Date: 05/09/2019 CLINICAL DATA:  Ataxia, stroke suspected. EXAM: MRI HEAD WITHOUT CONTRAST TECHNIQUE: Multiplanar, multiecho pulse sequences of the brain and surrounding structures were obtained without intravenous contrast. COMPARISON:  No pertinent prior studies available for comparison FINDINGS: Brain: Two punctate foci of diffusion weighted signal hyperintensity within the left pons are strongly to favored to reflect image noise (series 3, image 15). These are seen  on the axial diffusion-weighted imaging only, and not on the coronal diffusion-weighted imaging. No evidence of acute infarct elsewhere within the brain. Mild scattered T2/FLAIR hyperintensity within the cerebral white matter and brainstem is nonspecific, but consistent with chronic small vessel ischemic disease. Chronic  lacunar infarct within the right thalamus. No evidence of intracranial mass. No midline shift or extra-axial fluid collection. No chronic intracranial blood products. Mineralization within the bilateral basal ganglia and deep cerebellar nuclei. Mild generalized parenchymal atrophy. Partially empty sella turcica. Vascular: Flow voids maintained within the proximal large arterial vessels. Skull and upper cervical spine: No focal marrow lesion. Question C3-C4 fusion. Sinuses/Orbits: Visualized orbits demonstrate no acute abnormality. No significant paranasal sinus disease or mastoid effusion. These results were called by telephone at the time of interpretation on 05/09/2019 at 4:44 pm to provider Dr. Gustavus Messing, who verbally acknowledged these results. IMPRESSION: 1. Two punctate foci of diffusion weighted signal hyperintensity within the left pons are strongly favored to reflect image noise. Punctate acute infarcts cannot be definitively excluded. 2. Mild generalized parenchymal atrophy and chronic small vessel ischemic disease. 3. Chronic right thalamic lacunar infarct. 4. Small left mastoid effusion. Electronically Signed   By: Kellie Simmering   On: 05/09/2019 16:44   Dg Chest Port 1 View  Result Date: 05/09/2019 CLINICAL DATA:  Nausea, vomiting, weakness EXAM: PORTABLE CHEST 1 VIEW COMPARISON:  None. FINDINGS: Cardiomegaly. Both lungs are clear. The visualized skeletal structures are unremarkable. IMPRESSION: Cardiomegaly without acute abnormality of the lungs in AP portable projection. Electronically Signed   By: Eddie Candle M.D.   On: 05/09/2019 10:16     ASSESSMENT AND PLAN  68 year old female with a stroke risk factors of hypertension, hyperlipidemia and hypertension presents to the emergency department due to sudden loss again about nausea/vomiting.  MRI brain negative for acute infarct however given acuity of presentation favors a ischemic phenomenon.  Unlikely to be a TIA has symptoms have not completely  resolved.  She has mild neurological deficits and small punctate posterior circulation infarct can be missed on MRI at times.  Unlikely to be peripheral vertigo as she denies any sensation of room spinning/tinnitus.  Unlikely due to viral illness as this would not first presented with gait imbalance, nausea vomiting came later.  Likely MRI negative stroke   Recommend #CTA Head and neck #Transthoracic Echo  # Start patient on ASA 81mg  daily, Plavix 75 mg daily x 3 week s #Start or continue Atorvastatin 40 mg/other high intensity statin # BP goal: permissive HTN upto 220/120 mmHg  # HBAIC and Lipid profile # Telemetry monitoring # Frequent neuro checks #  stroke swallow screen #PT/OT evaluation   Please page stroke NP  Or  PA  Or MD from 8am -4 pm  as this patient from this time will be  followed by the stroke.   You can look them up on [email or web address]  Password [facility]   Sushanth Aroor [facility] Pager Number [phone]

## 2019-05-09 NOTE — ED Triage Notes (Signed)
Pt from home via ems; woke around 0200 with nausea; vomited 2-3 times between 0200 and calling ems; unsteady when standing, weak; 4 zofran given PTA w/ relief, 12 lead with EMS showed RBBB, unknown hx of same; no significant cardiac hx other than DM, HTN, takes insulin, per son pt doesn't take anything for HTN; denies cp, sob, abd pain  196/92 P 88 RR 16 99% RA CBG 172 97.54F

## 2019-05-09 NOTE — ED Notes (Signed)
Patient transported to CT 

## 2019-05-09 NOTE — ED Notes (Signed)
Pt requests beverage to help her provide urine sample, ok to give water per EDP, water provided

## 2019-05-09 NOTE — ED Provider Notes (Signed)
[facility]PART[facility] Provider Note   CSN: [id number] Arrival date & time: 05/09/19  [id number]     History   Chief Complaint Chief Complaint  Patient presents with  . Weakness  . Emesis    HPI Angela Salinas is a 68 y.o. female.     HPI Patient presents accompanied by a friend. She notes that she is generally well though she does have a history of poorly controlled diabetes, and hypertension. She was in her usual state of health until the last 12 hours or so. She notes that she did eat an unusual curry for her, but otherwise has had no recent diet, activity, medication changes. Now, over the past few hours, when she stands upright she feels weak, nauseous, has vomited. At rest, supine, she has no complaints, including chest pain, no abdominal pain. But with attempts at ambulation, when standing, she soon thereafter feels weak in her legs, nauseous, but without focal pain anywhere. No syncope, no falling. No medication taken for relief.   Past Medical History:  Diagnosis Date  . Diabetes mellitus without complication ([place])   . High cholesterol      OB History   No obstetric history on file.      Home Medications    Prior to Admission medications   Medication Sig Start Date End Date Taking? Authorizing Provider  atorvastatin (LIPITOR) 40 MG tablet Take 40 mg by mouth at bedtime. 03/01/19  Yes [provider]  Carboxymethylcellul-Glycerin 1-0.25 % SOLN Place 1 drop into both eyes daily as needed (dry eyes).   Yes [provider]  FARXIGA 10 MG TABS tablet Take 10 mg by mouth daily. 03/05/19  Yes [provider]  metFORMIN (GLUCOPHAGE-XR) 500 MG 24 hr tablet Take 500 mg by mouth 2 (two) times daily. 02/11/19  Yes [provider]  Multiple Vitamin (MULTIVITAMIN WITH MINERALS) TABS tablet Take 1 tablet by mouth daily.   Yes [provider]  NOVOLOG FLEXPEN 100 UNIT/ML FlexPen Inject 7-15 Units into  the skin 3 (three) times daily before meals. 03/12/19  Yes [provider]  TRESIBA FLEXTOUCH 100 UNIT/ML SOPN FlexTouch Pen Inject 12 Units into the skin daily. 04/11/19  Yes [provider]    Family History No family history on file.  Social History Social History   Tobacco Use  . Smoking status: Never Smoker  . Smokeless tobacco: Never Used  Substance Use Topics  . Alcohol use: Never    Frequency: Never  . Drug use: Never     Allergies   Codeine and Sulfa antibiotics   Review of Systems Review of Systems  Constitutional:       Per HPI, otherwise negative  HENT:       Per HPI, otherwise negative  Respiratory:       Per HPI, otherwise negative  Cardiovascular:       Per HPI, otherwise negative  Gastrointestinal: Positive for nausea and vomiting.  Endocrine:       Negative aside from HPI  Genitourinary:       Neg aside from HPI   Musculoskeletal:       Per HPI, otherwise negative  Skin: Negative.   Neurological: Positive for weakness. Negative for syncope.     Physical Exam Updated Vital Signs BP (!) 135/59   Pulse 86   Temp 97.8 F (36.6 C) (Oral)   Resp 16   Ht 4\' 10"  (1.473 m)   Wt 59 kg   SpO2 97%  BMI 27.17 kg/m   Physical Exam Vitals signs and nursing note reviewed.  Constitutional:      General: She is not in acute distress.    Appearance: She is well-developed.  HENT:     Head: Normocephalic and atraumatic.  Eyes:     Conjunctiva/sclera: Conjunctivae normal.  Cardiovascular:     Rate and Rhythm: Normal rate and regular rhythm.  Pulmonary:     Effort: Pulmonary effort is normal. No respiratory distress.     Breath sounds: Normal breath sounds. No stridor.  Abdominal:     General: There is no distension.     Tenderness: There is no abdominal tenderness. There is no guarding.  Skin:    General: Skin is warm and dry.  Neurological:     Mental Status: She is alert and oriented to person, place, and time.     Cranial  Nerves: No cranial nerve deficit.     Motor: No weakness.  Psychiatric:        Mood and Affect: Mood normal.      ED Treatments / Results  Labs (all labs ordered are listed, but only abnormal results are displayed) Labs Reviewed  COMPREHENSIVE METABOLIC PANEL - Abnormal; Notable for the following components:      Result Value   Glucose, Bld 128 (*)    AST 43 (*)    Alkaline Phosphatase 160 (*)    All other components within normal limits  CBC WITH DIFFERENTIAL/PLATELET - Abnormal; Notable for the following components:   Hemoglobin 11.5 (*)    MCH 24.8 (*)    RDW 18.7 (*)    All other components within normal limits  URINALYSIS, ROUTINE W REFLEX MICROSCOPIC - Abnormal; Notable for the following components:   Color, Urine STRAW (*)    Glucose, UA >=500 (*)    Ketones, ur 5 (*)    All other components within normal limits  LIPASE, BLOOD  I-STAT VENOUS BLOOD GAS, ED  TROPONIN I (HIGH SENSITIVITY)  TROPONIN I (HIGH SENSITIVITY)    EKG EKG Interpretation  Date/Time:  Saturday May 09 2019 09:09:58 EDT Ventricular Rate:  82 PR Interval:    QRS Duration: 119 QT Interval:  427 QTC Calculation: 499 R Axis:   59 Text Interpretation:  Sinus rhythm Incomplete right bundle branch block T wave abnormality Abnormal ECG Confirmed by Carmin Muskrat [phone]) on 05/09/2019 9:28:00 AM   Radiology Dg Chest Port 1 View  Result Date: 05/09/2019 CLINICAL DATA:  Nausea, vomiting, weakness EXAM: PORTABLE CHEST 1 VIEW COMPARISON:  None. FINDINGS: Cardiomegaly. Both lungs are clear. The visualized skeletal structures are unremarkable. IMPRESSION: Cardiomegaly without acute abnormality of the lungs in AP portable projection. Electronically Signed   By: Eddie Candle M.D.   On: 05/09/2019 10:16    Procedures Procedures (including critical care time)  Medications Ordered in ED Medications  ondansetron (ZOFRAN) injection 4 mg (4 mg Intravenous Given 05/09/19 0952)  sodium chloride 0.9 %  bolus 1,000 mL (0 mLs Intravenous Stopped 05/09/19 1214)  famotidine (PEPCID) IVPB 20 mg premix (0 mg Intravenous Stopped 05/09/19 1041)     Initial Impression / Assessment and Plan / ED Course  I have reviewed the triage vital signs and the nursing notes.  Pertinent labs & imaging results that were available during my care of the patient were reviewed by me and considered in my medical decision making (see chart for details).    Patient in no distress, now ambulatory, though with some dizziness.   Initial labs,  x-ray reviewed with the patient, generally reassuring aside from glucosuria, mild hyper glycemia. Patient is now accompanied by her son who is a Pension scheme manager. Together we discussed possibilities for her episode of gait difficulty, dizziness, including food reaction versus complication of diabetes versus CVA/TIA. MRI pending. 3:32 PM This elderly female presents with new dizziness, inconsistently. Patient is awake, alert, with a reassuring neurologic exam when at rest, no abdominal tenderness, no dyspnea, and generally improved here, both concern for ongoing dizziness, given her age, comorbidities, MRI is pending.  Dr. Sherry Ruffing is aware of the patient and will follow  Final Clinical Impressions(s) / ED Diagnoses  Dizziness   Carmin Muskrat, MD 05/09/19 [phone]

## 2019-05-09 NOTE — H&P (Addendum)
Date: 05/09/2019               Patient Name:  Angela Salinas MRN: [id number]  DOB: April 17, 1951 Age / Sex: 68 y.o., female   PCP: Patient, No Pcp Per         Medical Service: Internal Medicine Teaching Service         Attending Physician: Dr. Sherry Ruffing, Gwenyth Allegra, *    First Contact: Dr. Charleen Kirks Pager: [phone]  Second Contact: Dr. Myrtie Hawk Pager: [phone]       After Hours (After 5p/  First Contact Pager: [phone]  weekends / holidays): Second Contact Pager: [phone]   Chief Complaint: weakness, unsteady gait  History of Present Illness: Angela Salinas is a 68 year old female with PMhx of hyperlipidemia and type 2 diabetes without complications who presents with an unsteady gait.  Patient says she was asleep and woke up around 2 AM and noticed when she walked to the bathroom her gait was unsteady.  She woke up again around 4:30 AM reports feeling nauseous and vomited once.  She went to bed woke up again around 630 and continued to feel unsteady, so she called her son who is a radiation oncologist.  Her son told her to call 911 she was brought to the ED by EMS.  Patient was seen by neurology with no acute findings on MRI, but expect an acute stroke. On interview, patient say she continues to feel unsteady. Patient denies any changes in vision, slurring of speech, facial droop, chest pain, SOB, or changes in abdominal pains changes in bowel or urination.    Meds:  Current Meds  Medication Sig  . atorvastatin (LIPITOR) 40 MG tablet Take 40 mg by mouth at bedtime.  . Carboxymethylcellul-Glycerin 1-0.25 % SOLN Place 1 drop into both eyes daily as needed (dry eyes).  Marland Kitchen FARXIGA 10 MG TABS tablet Take 10 mg by mouth daily.  . metFORMIN (GLUCOPHAGE-XR) 500 MG 24 hr tablet Take 500 mg by mouth 2 (two) times daily.  . Multiple Vitamin (MULTIVITAMIN WITH MINERALS) TABS tablet Take 1 tablet by mouth daily.  Marland Kitchen NOVOLOG FLEXPEN 100 UNIT/ML FlexPen Inject 7-15 Units into the skin 3  (three) times daily before meals.  . TRESIBA FLEXTOUCH 100 UNIT/ML SOPN FlexTouch Pen Inject 12 Units into the skin daily.     Allergies: Allergies as of 05/09/2019 - Review Complete 05/09/2019  Allergen Reaction Noted  . Codeine Other (See Comments) 05/09/2019  . Sulfa antibiotics Other (See Comments) 05/09/2019   Past Medical History:  Diagnosis Date  . Diabetes mellitus without complication ([place])   . High cholesterol     Family History:  Multiple family members with Type 2 DM. Denies any history of stroke , heart disease, cancer or genetic disease in the family.   Social History: Lives with her husband who is 45 and has liver disease. She is his caretaker. Son is a Pension scheme manager and lives in [place] , [place].   Never tobacco user. No alcohol . No drugs. No herbal supplements.  Review of Systems: A complete ROS was negative except as per HPI.   Physical Exam: Blood pressure (!) 151/61, pulse 78, temperature 97.8 F (36.6 C), temperature source Oral, resp. rate 17, height 4\' 10"  (1.473 m), weight 59 kg, SpO2 100 %.  Physical Exam Constitutional:      General: She is not in acute distress. HENT:     Head: Normocephalic and atraumatic.  Neurological:     Mental Status:  She is alert.     Comments: Mental Status Patient is awake, alert, oriented x3 No signs of aphasia or neglect Cranial Nerves: II: Pupils equal, round, and reactive to light.  III,IV, VI: EOMI without ptosis or diploplia.  V: Facial sensation is symmetric tolight touch  VII: Facial movement is symmetric. ( patient has a left perioral droop, but this is nl since neck surgery) VIII: hearing is intact to voice X: Uvula elevates symmetrically XI: Shoulder shrug is symmetric. XII: tongue is midline without atrophy or fasciculations.  Motor: 5/5 bilateral UE, 5/5 bilateral lower extremitiy Sensory: Sensation is grossly intact bilateral UEs & LEs Deep Tendon Reflexes are symetric Cerebellar:  Finger-Nose and Heel-Shin are intact bilalateral     EKG: personally reviewed my interpretation is normal rate , rhythm, normal axis , widen QRS, right bundle branch block  CXR: personally reviewed my interpretation is cardiomegaly , no focal opacities or consolidations.    Assessment & Plan by Problem: Active Problems:   * No active hospital problems. *  [id number] [name] is a 68 year old female with PMhx of hyperlipidemia and type 2 diabetes without complications who presents with unsteady gait concerning for acute stroke.   #Acute Stroke Patient's unsteady gait concerning for small infarct not seen on MRI. Had episode of vomiting , but denies any symptoms of room spinning. Continues to feel unsteady , agree less likely peripheral vertigo.  Neurology consult and appreciate recommendations.  - CTA  Head and Neck -TTE - Asprin 81 mg - Plavix 75 mg daily x 3 weeks - Atorvastatin 40 mg - HbA1C and lipid profile -Telemetry monitoring, neuro checks, - SLP/PT/OT  #Type 2 DM - Hgb A1c - SSI-M  #Hyperlipidemia - atorvastatin   Diet: Carb modified VTE ppx: Lovenox Code: Full   Dispo: Admit patient to Observation with expected length of stay less than 2 midnights.  Signed:  Tamsen Snider, MD PGY1  [phone]

## 2019-05-09 NOTE — ED Notes (Signed)
Clarified MRI ordered at 1736 in ED narrator with EDP ; this is not a duplicate order. Communicated this to MRI staff, will arrive to transport pt shortly.

## 2019-05-10 ENCOUNTER — Observation Stay (HOSPITAL_BASED_OUTPATIENT_CLINIC_OR_DEPARTMENT_OTHER): Payer: Medicare Other

## 2019-05-10 DIAGNOSIS — E785 Hyperlipidemia, unspecified: Secondary | ICD-10-CM

## 2019-05-10 DIAGNOSIS — I10 Essential (primary) hypertension: Secondary | ICD-10-CM | POA: Diagnosis not present

## 2019-05-10 DIAGNOSIS — G459 Transient cerebral ischemic attack, unspecified: Secondary | ICD-10-CM

## 2019-05-10 DIAGNOSIS — I6389 Other cerebral infarction: Secondary | ICD-10-CM

## 2019-05-10 DIAGNOSIS — E1159 Type 2 diabetes mellitus with other circulatory complications: Secondary | ICD-10-CM

## 2019-05-10 DIAGNOSIS — I639 Cerebral infarction, unspecified: Secondary | ICD-10-CM | POA: Diagnosis present

## 2019-05-10 DIAGNOSIS — I6381 Other cerebral infarction due to occlusion or stenosis of small artery: Secondary | ICD-10-CM | POA: Diagnosis not present

## 2019-05-10 LAB — CBC
HCT: 35.1 % — ABNORMAL LOW (ref 36.0–46.0)
Hemoglobin: 11 g/dL — ABNORMAL LOW (ref 12.0–15.0)
MCH: 25.1 pg — ABNORMAL LOW (ref 26.0–34.0)
MCHC: 31.3 g/dL (ref 30.0–36.0)
MCV: 80.1 fL (ref 80.0–100.0)
Platelets: 227 10*3/uL (ref 150–400)
RBC: 4.38 MIL/uL (ref 3.87–5.11)
RDW: 18.9 % — ABNORMAL HIGH (ref 11.5–15.5)
WBC: 7 10*3/uL (ref 4.0–10.5)
nRBC: 0 % (ref 0.0–0.2)

## 2019-05-10 LAB — GLUCOSE, CAPILLARY
Glucose-Capillary: 139 mg/dL — ABNORMAL HIGH (ref 70–99)
Glucose-Capillary: 223 mg/dL — ABNORMAL HIGH (ref 70–99)

## 2019-05-10 LAB — HIV ANTIBODY (ROUTINE TESTING W REFLEX): HIV Screen 4th Generation wRfx: NONREACTIVE

## 2019-05-10 LAB — LIPID PANEL
Cholesterol: 95 mg/dL (ref 0–200)
HDL: 37 mg/dL — ABNORMAL LOW (ref >40)
LDL Cholesterol: 50 mg/dL (ref 0–99)
Total CHOL/HDL Ratio: 2.6 RATIO
Triglycerides: 42 mg/dL (ref <150)
VLDL: 8 mg/dL (ref 0–40)

## 2019-05-10 LAB — HEMOGLOBIN A1C
Hgb A1c MFr Bld: 7.3 % — ABNORMAL HIGH (ref 4.8–5.6)
Mean Plasma Glucose: 162.81 mg/dL

## 2019-05-10 LAB — ECHOCARDIOGRAM COMPLETE
Height: 59 in
Weight: 2003.54 oz

## 2019-05-10 LAB — CBG MONITORING, ED: Glucose-Capillary: 177 mg/dL — ABNORMAL HIGH (ref 70–99)

## 2019-05-10 LAB — VITAMIN B12: Vitamin B-12: 145 pg/mL — ABNORMAL LOW (ref 180–914)

## 2019-05-10 LAB — CREATININE, SERUM
Creatinine, Ser: 0.8 mg/dL (ref 0.44–1.00)
GFR calc Af Amer: >60 mL/min (ref >60)
GFR calc non Af Amer: >60 mL/min (ref >60)

## 2019-05-10 MED ORDER — CLOPIDOGREL BISULFATE 75 MG PO TABS: 75.0000 mg | ORAL_TABLET | Freq: Every day | ORAL | 0 refills | Status: AC

## 2019-05-10 MED ORDER — ATORVASTATIN CALCIUM 40 MG PO TABS: 40.0000 mg | ORAL_TABLET | Freq: Every day | ORAL | 0 refills | Status: DC

## 2019-05-10 MED ORDER — ASPIRIN 81 MG PO TBEC: 81.0000 mg | DELAYED_RELEASE_TABLET | Freq: Every day | ORAL | 0 refills | Status: AC

## 2019-05-10 MED ORDER — CYANOCOBALAMIN 1000 MCG/ML IJ SOLN
1000.0000 ug | Freq: Once | INTRAMUSCULAR | Status: AC
  Administered 2019-05-10: 1000 ug via INTRAMUSCULAR
  Filled 2019-05-10: qty 1

## 2019-05-10 NOTE — TOC Initial Note (Signed)
Transition of Care [facility]) - Initial/Assessment Note    Patient Details  Name: Angela Salinas MRN: [id number] Date of Birth: 1951/08/01  Transition of [facility] CM/SW Contact:    Carles Collet, RN Phone Number: 05/10/2019, 2:25 PM  Clinical Narrative:      Angela Salinas, PT discussed plan. SPoke w patient, she would like to use [facility] as she has in the past and believes her husband may be active with them as well. Requested HH PT (vestibular if available) and [place] OT. Patient may transition quickly to OP Vestibular. No DME or other CM needs identified at this time.                Barriers to Discharge: Continued Medical Work up   Patient Goals and CMS Choice Patient states their goals for this hospitalization and ongoing recovery are:: to go home CMS Medicare.gov Compare Post Acute Care list provided to:: Patient Choice offered to / list presented to : Patient  Expected Discharge Plan and Services                                     HH Arranged: PT, OT [place] Agency: [place] Date [facility] Agency Contacted: 05/10/19 Time HH Agency Contacted: 73 Representative spoke with at [place]: cory  Prior Living Arrangements/Services                       Activities of Daily Living Home Assistive Devices/Equipment: None ADL Screening (condition at time of admission) Patient's cognitive ability adequate to safely complete daily activities?: Yes Is the patient deaf or have difficulty hearing?: No Does the patient have difficulty seeing, even when wearing glasses/contacts?: No Does the patient have difficulty concentrating, remembering, or making decisions?: No Patient able to express need for assistance with ADLs?: Yes Does the patient have difficulty dressing or bathing?: No Independently performs ADLs?: Yes (appropriate for developmental age) Does the patient have difficulty walking or climbing stairs?: Yes Weakness of Legs: None Weakness of Arms/Hands:  None  Permission Sought/Granted                  Emotional Assessment              Admission diagnosis:  Dizziness [R42] Patient Active Problem List   Diagnosis Date Noted  . Stroke ([place]) 05/10/2019  . CVA (cerebral vascular accident) ([place]) 05/09/2019   PCP:  Patient, No Pcp Per Pharmacy:   [facility] [place], [address] [place] [address] Phone: [phone] Fax: [phone]     Social Determinants of Health (SDOH) Interventions    Readmission Risk Interventions No flowsheet data found.

## 2019-05-10 NOTE — Plan of Care (Signed)
Patient given stoke book and reviewed risk factors.

## 2019-05-10 NOTE — Progress Notes (Signed)
OT Cancellation Note  Patient Details Name: Angela Salinas MRN: [id number] DOB: 11-10-1950   Cancelled Treatment:    Reason Eval/Treat Not Completed: Patient at procedure or test/ unavailable, ECHO.  Will follow and see as able.   Delight Stare, OT Acute Rehabilitation Services Pager [phone] Office [phone]    Delight Stare 05/10/2019, 8:57 AM

## 2019-05-10 NOTE — Progress Notes (Signed)
Pt arrived to [place], alert and oriented x 4, VS stable, no signs of acute distress. Pt ambulated from stretcher to bed with slightly unsteady gait/left side.  Pt identified appropriately, cardiac monitor in place and CCMD notified. Pt oriented to room and equipment, instructed to call for assistance and how to use call bell. Bed alarm activated and call bell left within reach. Will continue to monitor and treat pt per MD orders.

## 2019-05-10 NOTE — Progress Notes (Signed)
Subjective:   Angela Salinas reports she is feeling better today. She denies any continued nausea or vomiting since being given medication for it. She states that she still feels unsteady but that it is not as severe as prior. She denies any focal weakness in her extremities, chest pain, SOB, urinary or bowel changes.   She notes that she has been under increased stress lately, so it has been difficult to keep up with all of her medications, especially her insulin around mealtime. Regarding B12 deficiency, she endorses increased fatigue in the past several weeks. Notes that she is a vegetarian.   Objective:  Vital signs in last 24 hours: Vitals:   05/10/19 0400 05/10/19 0534 05/10/19 0544 05/10/19 0821  BP: (!) 141/58 (!) 137/47  (!) 131/91  Pulse: 63 69  64  Resp: 12 16  18   Temp:  98.3 F (36.8 C)  98.4 F (36.9 C)  TempSrc:  Oral  Oral  SpO2: 99% 99%  100%  Weight:   56.8 kg   Height:   4\' 11"  (1.499 m)     Physical Exam Vitals signs and nursing note reviewed.  Constitutional:      Appearance: She is normal weight.  Cardiovascular:     Rate and Rhythm: Normal rate and regular rhythm.     Heart sounds: No murmur.  Pulmonary:     Effort: Pulmonary effort is normal. No respiratory distress.     Breath sounds: No stridor. Rales (very mild at bases bilaterally) present. No wheezing or rhonchi.  Skin:    General: Skin is warm and dry.  Neurological:     Mental Status: She is alert and oriented to person, place, and time.  Psychiatric:        Mood and Affect: Mood normal.        Behavior: Behavior normal.    Assessment/Plan:  Active Problems:   CV[facility] (cerebral vascular accident) [facility])   Stroke [facility])  Angela Salinas is a 68 year old female with PMhx of hyperlipidemia and type 2 diabetes without complications who presents with unsteady gait concerning for acute stroke.   # Truncal Ataxia # MRI negative CVA CT head was negative for acute process but does note an  age indeterminate right thalamic small vessel infarct. Two MRI with different slices were obtained and both did not show an obvious site of infarction. CTA negative. TTE was negative for any embolic sources, nor does the patient have history of arrhythmia. Neurology has been on board and suspects could be MRI negative CVA since her symptoms have persisted. Notes it is possible with posterior circulation strokes. Vertigo less likely as patient has not endorses any dizziness or room spinning sensations.   - Asprin 81 mg - Plavix 75 mg daily x 3 weeks - Atorvastatin 40 mg - Telemetry monitoring - SLP/PT/OT evaluation   # Type 2 Diabetes Mellitus w/o complications [id number] is fairly well controlled at 7.3%. Patient notes she has had difficulty with maintaining regimen of Farxiga, Tresiba and Novolog at home, especially her Novolog, as her eating schedule has become irregular. No PCP on chart, so unsure who manages her diabetes.    - SSI (moderate)   # Hyperlipidemia Very well controlled with Atorvastatin as her LDL is 50. Continue with home medication   - Atorvastatin 40mg  QD   # Normocytic Anemia # B12 Deficiency Angela Salinas notes a history of iron deficiency anemia but denies being on treatment for it. No known prior history of B12  deficiency. Her anemia is likely both microcytic and macrocytic masked as normocytic, supported by her elevated RDW. Will start treating her B12 deficiency and check iron panel to evaluate how deficient she is. If very low iron, will plan for IV supplementation.   - B12 1034mcg IM injection - Iron panel pending   Dispo: Anticipated discharge pending further medical work up and PT/OT evaluation.   Dr. Jose Persia Internal Medicine PGY-1  Pager: [phone] 05/10/2019, 2:46 PM

## 2019-05-10 NOTE — Care Management Obs Status (Signed)
[place] NOTIFICATION   Patient Details  Name: Angela Salinas MRN: [id number] Date of Birth: 1951-08-06   Medicare Observation Status Notification Given:  Yes    Carles Collet, RN 05/10/2019, 4:31 PM

## 2019-05-10 NOTE — Care Management CC44 (Signed)
Condition Code 44 Documentation Completed  Patient Details  Name: Angela Salinas MRN: [id number] Date of Birth: 1951/05/22   Condition Code 44 given:  Yes Patient signature on Condition Code 44 notice:  Yes Documentation of 2 MD's agreement:  Yes Code 44 added to claim:  Yes    Carles Collet, RN 05/10/2019, 4:31 PM

## 2019-05-10 NOTE — Progress Notes (Signed)
  Echocardiogram 2D Echocardiogram has been performed.  Angela Salinas 05/10/2019, 9:41 AM

## 2019-05-10 NOTE — Evaluation (Signed)
Physical Therapy Evaluation Patient Details Name: Angela Salinas MRN: [id number] DOB: 1951/02/16 Today's Date: 05/10/2019   History of Present Illness  Angela Salinas is a 68 year old female with PMhx of hyperlipidemia and type 2 diabetes without complications who presents with unsteady gait concerning for acute stroke. She has mild neurological deficits and small punctate posterior circulation infarct can be missed on MRI at times.   Clinical Impression  Pt admitted with above diagnosis. Independent at baseline, drives, grocery shops; Presents to PT with gait and balance dysfunction; Recommend continuing efforts to smooth out gait pattern, decr fall risk; Consider Vestibular follow up;  Pt currently with functional limitations due to the deficits listed below (see PT Problem List). Pt will benefit from skilled PT to increase their independence and safety with mobility to allow discharge to the venue listed below.       Follow Up Recommendations Home health PT;Other (comment)(consider HHOT as well)    Equipment Recommendations  None recommended by PT    Recommendations for Other Services       Precautions / Restrictions Precautions Precautions: Fall Precaution Comments: fall risk is low, but present      Mobility  Bed Mobility Overal bed mobility: Modified Independent             General bed mobility comments: Incr time and effort  Transfers Overall transfer level: Needs assistance Equipment used: None Transfers: Sit to/from Stand Sit to Stand: Min guard(without physical assist)         General transfer comment: Dependent on UEs to steady, but good rise  Ambulation/Gait Ambulation/Gait assistance: Min guard(without physical contact) Gait Distance (Feet): 200 Feet Assistive device: None Gait Pattern/deviations: Decreased step length - right;Decreased step length - left;Decreased stride length;Wide base of support Gait velocity: slwo   General Gait  Details: Walked in hallway without assistive device, slow, guarded gait; at times choppy steps with erratic step width; no gross loss of balance; cues to self-monitor for activity tolerance  Stairs            Wheelchair Mobility    Modified Rankin (Stroke Patients Only) Modified Rankin (Stroke Patients Only) Pre-Morbid Rankin Score: No symptoms Modified Rankin: Slight disability     Balance Overall balance assessment: Needs assistance           Standing balance-Leahy Scale: Fair                               Pertinent Vitals/Pain Pain Assessment: No/denies pain    Home Living Family/patient expects to be discharged to:: Private residence Living Arrangements: Spouse/significant other Available Help at Discharge: Family;Available PRN/intermittently Type of Home: House Home Access: Stairs to enter Entrance Stairs-Rails: Right Entrance Stairs-Number of Steps: 3 Home Layout: Two level;Bed/bath upstairs        Prior Function Level of Independence: Independent         Comments: Driving, grocery shopping, caregiving for hsuband     Hand Dominance        Extremity/Trunk Assessment   Upper Extremity Assessment Upper Extremity Assessment: Overall WFL for tasks assessed(for simple tasks)    Lower Extremity Assessment Lower Extremity Assessment: Generalized weakness    Cervical / Trunk Assessment Cervical / Trunk Assessment: Normal  Communication   Communication: No difficulties  Cognition Arousal/Alertness: Awake/alert Behavior During Therapy: WFL for tasks assessed/performed Overall Cognitive Status: Within Functional Limits for tasks assessed  General Comments      Exercises     Assessment/Plan    PT Assessment Patient needs continued PT services  PT Problem List Decreased activity tolerance;Decreased balance;Decreased mobility;Decreased knowledge of use of DME       PT  Treatment Interventions DME instruction;Gait training;Stair training;Functional mobility training;Therapeutic activities;Therapeutic exercise;Balance training;Neuromuscular re-education;Patient/family education    PT Goals (Current goals can be found in the Care Plan section)  Acute Rehab PT Goals Patient Stated Goal: hopes to be able to go home today PT Goal Formulation: With patient Time For Goal Achievement: 05/24/19 Potential to Achieve Goals: Good    Frequency Min 4X/week   Barriers to discharge Decreased caregiver support(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 : A Little 6 Click Score: 21    End of Session Equipment Utilized During Treatment: Gait belt Activity Tolerance: Patient tolerated treatment well Patient left: in chair;with call bell/phone within reach;with chair alarm set Nurse Communication: Mobility status PT Visit Diagnosis: Unsteadiness on feet (R26.81);Other abnormalities of gait and mobility (R26.89)    Time: [id number] PT Time Calculation (min) (ACUTE ONLY): 30 min   Charges:   PT Evaluation $PT Eval Moderate Complexity: 1 Mod PT Treatments $Gait Training: 8-22 mins        Roney Marion, PT  Acute Rehabilitation Services Pager [phone] Office [phone]   Colletta [place] 05/10/2019, 2:12 PM

## 2019-05-10 NOTE — Discharge Summary (Signed)
Name: Angela Salinas MRN: [id number] DOB: [birth year] 68 y.o. PCP: Patient, No Pcp Per  Date of Admission: 05/09/2019  8:53 AM Date of Discharge:  Attending Physician: Bartholomew Crews, MD  Discharge Diagnosis: 1. Acute CVA vs TIA 2. Normocytic Anemia 3. Vitamin B12 Deficiency  Discharge Medications: Allergies as of 05/10/2019      Reactions   Codeine Other (See Comments)   lightheaded   Sulfa Antibiotics Other (See Comments)   Cannot recall      Medication List    TAKE these medications   aspirin 81 MG EC tablet Take 1 tablet (81 mg total) by mouth daily. Start taking on: May 11, 2019   atorvastatin 40 MG tablet Commonly known as: LIPITOR Take 1 tablet (40 mg total) by mouth at bedtime.   Carboxymethylcellul-Glycerin 1-0.25 % Soln Place 1 drop into both eyes daily as needed (dry eyes).   clopidogrel 75 MG tablet Commonly known as: PLAVIX Take 1 tablet (75 mg total) by mouth daily. Start taking on: May 11, 2019   Farxiga 10 MG Tabs tablet Generic drug: dapagliflozin propanediol Take 10 mg by mouth daily.   metFORMIN 500 MG 24 hr tablet Commonly known as: GLUCOPHAGE-XR Take 500 mg by mouth 2 (two) times daily.   multivitamin with minerals Tabs tablet Take 1 tablet by mouth daily.   NovoLOG FlexPen 100 UNIT/ML FlexPen Generic drug: insulin aspart Inject 7-15 Units into the skin 3 (three) times daily before meals.   Tyler Aas FlexTouch 100 UNIT/ML Sopn FlexTouch Pen Generic drug: insulin degludec Inject 12 Units into the skin daily.       Disposition and follow-up:   Ms.Angela Salinas was discharged from [facility] in Good condition.  At the hospital follow up visit please address:  1.  Acute CVA vs TIA: Risk factor management to prevent future CVA. Progress of truncal ataxia.  Multifactorial Anemia: Likely both iron and B-12 deficient. B-12 IM injections initiated during admission  2.  Labs / imaging needed at  time of follow-up: Iron panel, Vitamin B-12 monitoring  3.  Pending labs/ test needing follow-up: None  Follow-up Appointments: Follow-up Information    [facility]. Schedule an appointment as soon as possible for a visit in 4 week(s).   Specialty: Neurology Contact information: [address] [place] [phone]       Primary Care Physician Follow up in 1 week(s).   Why: Schedule a hospital follow up appointment           Hospital Course by problem list: 1. Acute CVA vs TIA:  Patient presented to the ED with acute onset unsteady gait and nausea.  Symptoms continue to persist which made the diagnosis of vertigo less likely neurology was consulted and imaging was obtained.  CT head was negative for acute process but did note an age-indeterminate right thalamic small vessel infarct.  2 MRIs were obtained with different slices that did not show an obvious site of infarction.  CTA was negative.  TTE was negative for embolic source.  Neurology felt like this is likely an MRI negative for CVA.  She was started on Plavix and aspirin in addition to atorvastatin.  Plavix to be continued for 3 weeks and aspirin indefinitely.  PT recommended home health due to persistent ataxic gait.  Low up with neurology set up for 4 weeks.  2. Normocytic Anemia:  Hemoglobin on presentation was 11.5 and decreased to 11.0 on second day of admission.  MCV was borderline at 80.  Likely multifactorial.  Possibly also due to iron deficiency anemia since she does have a B12 deficiency that would make her anemia macrocytic, wondering if there is a microcytic aspect to it.  Recommend follow-up with primary care.  3. Vitamin B-12 Deficiency:  Due to presence of ataxic gait, B12 was measured and found to be low at 145.  She had her first B12 injection while in the hospital recommended she continue weekly injections in the outpatient setting until numbers begin to  normalize and then taper down to monthly injections.   Discharge Vitals:   BP (!) 131/91 (BP Location: Left Arm)   Pulse 64   Temp 98.4 F (36.9 C) (Oral)   Resp 18   Ht 4\' 11"  (1.499 m)   Wt 56.8 kg   SpO2 100%   BMI 25.29 kg/m   Pertinent Labs, Studies, and Procedures:   Hemoglobin: 11.0 MCV: 80 B12: 145  Ct Angio Head W Or Wo Contrast Ct Angio Neck W Or Wo Contrast 05/09/2019 IMPRESSION:  1. No emergent large vessel occlusion or high-grade stenosis.  2. Age-indeterminate right thalamic small vessel infarct, likely subacute or chronic.  3. Aortic Atherosclerosis (ICD10-I70.0).   Mr Brain Wo Contrast 05/09/2019 IMPRESSION:  Thin-section axial, coronal and sagittal diffusion-weighted imaging performed through the region of the brainstem. No evidence of acute infarct within the pons.   Mr Brain Wo Contrast  05/09/2019 IMPRESSION:  1. Two punctate foci of diffusion weighted signal hyperintensity within the left pons are strongly favored to reflect image noise. Punctate acute infarcts cannot be definitively excluded.  2. Mild generalized parenchymal atrophy and chronic small vessel ischemic disease.  3. Chronic right thalamic lacunar infarct.  4. Small left mastoid effusion.   Dg Chest Port 1 View 05/09/2019 IMPRESSION:  Cardiomegaly without acute abnormality of the lungs in AP portable projection.  Transthoracic Echocardiogram  10/4//2020 Impression  1. Left ventricular ejection fraction, by visual estimation, is 55 to 60%. The left ventricle has normal function. Normal left ventricular size. There is mildly increased left ventricular hypertrophy.  2. Left ventricular diastolic Doppler parameters are consistent with pseudonormalization pattern of LV diastolic filling.  3. Global right ventricle has normal systolic function.The right ventricular size is normal. No increase in right ventricular wall thickness.  4. Left atrial size was normal.  5. Right atrial size  was normal.  6. Trivial pericardial effusion is present.  7. The pericardial effusion is posterior to the left ventricle.  8. The mitral valve is grossly normal. Trace mitral valve regurgitation.  9. The tricuspid valve is grossly normal. Tricuspid valve regurgitation is trivial. 10. The aortic valve is tricuspid Aortic valve regurgitation was not visualized by color flow Doppler. Mild aortic valve sclerosis without stenosis. 11. The inferior vena cava is normal in size with greater than 50% respiratory variability, suggesting right atrial pressure of 3 mmHg. 12. The pulmonic valve was not well visualized. Pulmonic valve regurgitation is not visualized by color flow Doppler. 13. TR signal is inadequate for assessing pulmonary artery systolic pressure. 14. Presence of pericardial fat pad.  Discharge Instructions: Discharge Instructions    Ambulatory referral to Neurology   Complete by: As directed    Follow up with stroke clinic NP (Jessica Vanschaick or Cecille Rubin, if both not available, consider Zachery Dauer, or [place]) at [facility] in about 4 weeks. Thanks.   Call MD for:   Complete by: As directed    Worsening in unsteadiness   Call  MD for:  difficulty breathing, headache or visual disturbances   Complete by: As directed    Call MD for:  persistant nausea and vomiting   Complete by: As directed    Call MD for:  severe uncontrolled pain   Complete by: As directed    Diet - low sodium heart healthy   Complete by: As directed    Discharge instructions   Complete by: As directed    Thank you for allowing [place] to care for you during your admission!   You were admitted due to concerns for a small stroke. Your imaging did not show any evidence of a stroke, which supports a possible "MRI-negative stroke," as you discussed with the Neurology team. Treatment for this includes 3 weeks of a medication called Clopidogrel (Plavix). In addition, take Aspirin 81mg  every day. After 3 weeks, continue  with just Aspirin daily.   While at [facility], we found that you have anemia (low blood counts) with a Vitamin B12 deficiency. We gave you a shot of Vitamin B12 that is good for 1 week. Contact your primary care physician to continue weekly B12 injections until your levels return to normal. In addition, follow up with your physician to ask if you need to take iron supplements as well.   It was a pleasure meeting you!   Increase activity slowly   Complete by: As directed       Signed: Dr. Jose Persia Internal Medicine PGY-1  Pager: [phone] 05/10/2019, 4:25 PM

## 2019-05-10 NOTE — Progress Notes (Signed)
STROKE TEAM PROGRESS NOTE   INTERVAL HISTORY Her son is at the bedside.  She felt better and walked with PT in the hallway, felt much improved. PT recommend [facility]. She denies N/V anymore.    OBJECTIVE Vitals:   05/10/19 0400 05/10/19 0534 05/10/19 0544 05/10/19 0821  BP: (!) 141/58 (!) 137/47  (!) 131/91  Pulse: 63 69  64  Resp: 12 16  18   Temp:  98.3 F (36.8 C)  98.4 F (36.9 C)  TempSrc:  Oral  Oral  SpO2: 99% 99%  100%  Weight:   56.8 kg   Height:   4\' 11"  (1.499 m)     CBC:  Recent Labs  Lab 05/09/19 0943 05/10/19 0334  WBC 8.8 7.0  NEUTROABS 7.3  --   HGB 11.5* 11.0*  HCT 37.4 35.1*  MCV 80.8 80.1  PLT 221 [id number]    Basic Metabolic Panel:  Recent Labs  Lab 05/09/19 0943 05/10/19 0334  NA 139  --   K 4.2  --   CL 103  --   CO2 25  --   GLUCOSE 128*  --   BUN 14  --   CREATININE 0.82 0.80  CALCIUM 9.2  --     Lipid Panel:     Component Value Date/Time   CHOL 95 05/10/2019 0334   TRIG 42 05/10/2019 0334   HDL 37 (L) 05/10/2019 0334   CHOLHDL 2.6 05/10/2019 0334   VLDL 8 05/10/2019 0334   LDLCALC 50 05/10/2019 0334   HgbA1c:  Lab Results  Component Value Date   HGBA1C 7.3 (H) 05/10/2019   Urine Drug Screen: No results found for: LABOPIA, COCAINSCRNUR, LABBENZ, AMPHETMU, THCU, LABBARB  Alcohol Level No results found for: ETH  IMAGING  Ct Angio Head W Or Wo Contrast Ct Angio Neck W Or Wo Contrast 05/09/2019 IMPRESSION:  1. No emergent large vessel occlusion or high-grade stenosis.  2. Age-indeterminate right thalamic small vessel infarct, likely subacute or chronic.  3. Aortic Atherosclerosis (ICD10-I70.0).   Mr Brain Wo Contrast 05/09/2019 IMPRESSION:  Thin-section axial, coronal and sagittal diffusion-weighted imaging performed through the region of the brainstem. No evidence of acute infarct within the pons.   Mr Brain Wo Contrast (neuro Protocol) 05/09/2019 IMPRESSION:  1. Two punctate foci of diffusion weighted signal hyperintensity  within the left pons are strongly favored to reflect image noise. Punctate acute infarcts cannot be definitively excluded.  2. Mild generalized parenchymal atrophy and chronic small vessel ischemic disease.  3. Chronic right thalamic lacunar infarct.  4. Small left mastoid effusion.   Dg Chest Port 1 View 05/09/2019 IMPRESSION:  Cardiomegaly without acute abnormality of the lungs in AP portable projection.  Transthoracic Echocardiogram  10/4//2020 Impression  1. Left ventricular ejection fraction, by visual estimation, is 55 to 60%. The left ventricle has normal function. Normal left ventricular size. There is mildly increased left ventricular hypertrophy.  2. Left ventricular diastolic Doppler parameters are consistent with pseudonormalization pattern of LV diastolic filling.  3. Global right ventricle has normal systolic function.The right ventricular size is normal. No increase in right ventricular wall thickness.  4. Left atrial size was normal.  5. Right atrial size was normal.  6. Trivial pericardial effusion is present.  7. The pericardial effusion is posterior to the left ventricle.  8. The mitral valve is grossly normal. Trace mitral valve regurgitation.  9. The tricuspid valve is grossly normal. Tricuspid valve regurgitation is trivial. 10. The aortic valve is tricuspid Aortic valve  regurgitation was not visualized by color flow Doppler. Mild aortic valve sclerosis without stenosis. 11. The inferior vena cava is normal in size with greater than 50% respiratory variability, suggesting right atrial pressure of 3 mmHg. 12. The pulmonic valve was not well visualized. Pulmonic valve regurgitation is not visualized by color flow Doppler. 13. TR signal is inadequate for assessing pulmonary artery systolic pressure. 14. Presence of pericardial fat pad.   ECG - SR rate 82 BPM. (See cardiology reading for complete details)    PHYSICAL EXAM  Temp:  [98.3 F (36.8 C)-98.4 F (36.9  C)] 98.4 F (36.9 C) (10/04 0821) Pulse Rate:  [63-125] 64 (10/04 0821) Resp:  [12-19] 18 (10/04 0821) BP: (121-179)/(47-96) 131/91 (10/04 0821) SpO2:  [94 %-100 %] 100 % (10/04 0821) Weight:  [56.8 kg] 56.8 kg (10/04 0544)  General - Well nourished, well developed, in no apparent distress.  Ophthalmologic - fundi not visualized due to noncooperation.  Cardiovascular - Regular rhythm and rate.  Mental Status -  Level of arousal and orientation to time, place, and person were intact. Language including expression, naming, repetition, comprehension was assessed and found intact. Fund of Knowledge was assessed and was intact.  Cranial Nerves II - XII - II - Visual field intact OU. III, IV, VI - Extraocular movements intact. V - Facial sensation intact bilaterally. VII - Facial movement intact bilaterally. VIII - Hearing & vestibular intact bilaterally, no nystagmus. X - Palate elevates symmetrically. XI - Chin turning & shoulder shrug intact bilaterally. XII - Tongue protrusion intact.  Motor Strength - The patient's strength was normal in all extremities and pronator drift was absent.  Bulk was normal and fasciculations were absent.   Motor Tone - Muscle tone was assessed at the neck and appendages and was normal.  Reflexes - The patient's reflexes were symmetrical in all extremities and she had no pathological reflexes.  Sensory - Light touch, temperature/pinprick were assessed and were symmetrical.    Coordination - The patient had normal movements in the hands and right foot with no ataxia or dysmetria. However, seems to have mild dysmetria at left HTS. Tremor was absent.  Gait and Station - deferred.    ASSESSMENT/PLAN [name] is a 68 y.o. female with history of diabetes mellitus, hyperlipidemia, and hypertension presenting with sudden onset of unsteady gait, nausea and vomiting. She did not receive IV t-PA due to late presentation (>4.5 hours from time  of onset).  TIA vs. Small MRI negative posterior infarct - likely secondary to small vessel disease source   MRI head - no acute infarct. Chronic right thalamic lacunar infarct.   CTA H&N - No emergent large vessel occlusion or high-grade stenosis. Age-indeterminate right thalamic small vessel infarct, likely subacute or chronic.   2D Echo - EF 55 - 60%. No cardiac source of emboli identified.   Sars Corona Virus 2 - negative  LDL - 50  HgbA1c - 7.3  VTE prophylaxis - Lovenox  No antithrombotic prior to admission, now on aspirin 81 mg daily and clopidogrel 75 mg daily. Continue DAPT for 3 weeks and then plavix alone  Patient counseled to be compliant with her antithrombotic medications  Ongoing aggressive stroke risk factor management  Therapy recommendations:  pending  Disposition:  Pending  Hypertension  Home BP meds: none  Stable . Permissive hypertension (OK if < 220/120) but gradually normalize in 3-5 days  . Long-term BP goal normotensive  Hyperlipidemia  Home Lipid lowering medication: Lipitor 40 mg daily  LDL 50, goal < 70  Current lipid lowering medication: Lipitor 40 mg daily   Continue statin at discharge  Diabetes  Home diabetic meds: Metformin, insulin and Farxiga  HgbA1c 7.3, goal < 7.0  SSI  CBG monitoring  Close PCP follow up for better DM control  Other Stroke Risk Factors  Advanced age  Hx stroke/TIA - Chronic right thalamic lacunar infarct on MRI  Other Active Problems  Small left mastoid effusion on MRI  Hospital day # 0   Neurology will sign off. Please call with questions. Pt will follow up with stroke clinic NP at [facility] in about 4 weeks. Thanks for the consult.  Rosalin Hawking, MD PhD Stroke Neurology 05/10/2019 3:31 PM   To contact Stroke Continuity provider, please refer to [email or web address] After hours, contact General Neurology

## 2019-05-11 DIAGNOSIS — I10 Essential (primary) hypertension: Secondary | ICD-10-CM | POA: Diagnosis not present

## 2019-05-11 DIAGNOSIS — E118 Type 2 diabetes mellitus with unspecified complications: Secondary | ICD-10-CM | POA: Diagnosis not present

## 2019-05-11 DIAGNOSIS — E782 Mixed hyperlipidemia: Secondary | ICD-10-CM | POA: Diagnosis not present

## 2019-05-25 DIAGNOSIS — E11319 Type 2 diabetes mellitus with unspecified diabetic retinopathy without macular edema: Secondary | ICD-10-CM | POA: Diagnosis not present

## 2019-05-25 DIAGNOSIS — D649 Anemia, unspecified: Secondary | ICD-10-CM | POA: Diagnosis not present

## 2019-05-25 DIAGNOSIS — I10 Essential (primary) hypertension: Secondary | ICD-10-CM | POA: Diagnosis not present

## 2019-05-25 DIAGNOSIS — E782 Mixed hyperlipidemia: Secondary | ICD-10-CM | POA: Diagnosis not present

## 2019-05-25 DIAGNOSIS — R5383 Other fatigue: Secondary | ICD-10-CM | POA: Diagnosis not present

## 2019-05-25 DIAGNOSIS — E118 Type 2 diabetes mellitus with unspecified complications: Secondary | ICD-10-CM | POA: Diagnosis not present

## 2019-05-26 ENCOUNTER — Encounter: Payer: Self-pay | Admitting: Adult Health

## 2019-05-26 ENCOUNTER — Ambulatory Visit (INDEPENDENT_AMBULATORY_CARE_PROVIDER_SITE_OTHER): Payer: Medicare Other | Admitting: Adult Health

## 2019-05-26 ENCOUNTER — Other Ambulatory Visit: Payer: Self-pay

## 2019-05-26 VITALS — BP 111/56 | HR 74 | Temp 98.1°F | Ht 59.0 in | Wt 132.8 lb

## 2019-05-26 DIAGNOSIS — E785 Hyperlipidemia, unspecified: Secondary | ICD-10-CM

## 2019-05-26 DIAGNOSIS — R42 Dizziness and giddiness: Secondary | ICD-10-CM | POA: Diagnosis not present

## 2019-05-26 DIAGNOSIS — E119 Type 2 diabetes mellitus without complications: Secondary | ICD-10-CM

## 2019-05-26 DIAGNOSIS — G459 Transient cerebral ischemic attack, unspecified: Secondary | ICD-10-CM

## 2019-05-26 DIAGNOSIS — Z794 Long term (current) use of insulin: Secondary | ICD-10-CM | POA: Diagnosis not present

## 2019-05-26 DIAGNOSIS — I10 Essential (primary) hypertension: Secondary | ICD-10-CM | POA: Diagnosis not present

## 2019-05-26 NOTE — Progress Notes (Signed)
I agree with the above plan 

## 2019-05-26 NOTE — Patient Instructions (Signed)
Continue aspirin 81 mg daily and clopidogrel 75 mg daily  for secondary stroke prevention. Complete 3 week course of plavix and then discontinue and continue on aspirin alone  Referral placed for physical therapy - you should be called to schedule visit - if you are not called by end of the week, please call them to schedule visit  Continue to follow up with PCP regarding cholesterol, blood pressure and diabetes management   Continue to monitor blood pressure at home  Maintain strict control of hypertension with blood pressure goal below 130/90, diabetes with hemoglobin A1c goal below 6.5% and cholesterol with LDL cholesterol (bad cholesterol) goal below 70 mg/dL. I also advised the patient to eat a healthy diet with plenty of whole grains, cereals, fruits and vegetables, exercise regularly and maintain ideal body weight.  Followup in the future with me in 3 months or call earlier if needed       Thank you for coming to see [place] at [facility]. I hope we have been able to provide you high quality care today.  You may receive a patient satisfaction survey over the next few weeks. We would appreciate your feedback and comments so that we may continue to improve ourselves and the health of our patients.

## 2019-05-26 NOTE — Progress Notes (Signed)
[facility] [address] [place]. [place] [address] [phone]       HOSPITAL FOLLOW UP NOTE  [name] Date of Birth:  06/14/1951 Medical Record Number:  [id number]   Reason for Referral:  hospital stroke follow up    CHIEF COMPLAINT:  Chief Complaint  Patient presents with   Hospitalization Follow-up    Husband present. Rm 9. Patient stated that she does some fatigue but overall she feels fine     HPI: Angela Salinas being seen today for in office hospital follow-up regarding TIA versus small MRI negative posterior infarct likely secondary to small vessel disease source on 05/09/2019.  History obtained from patient, husband and chart review. Reviewed all radiology images and labs personally.  [name] is a 68 y.o. female with history of diabetes mellitus, hyperlipidemia, and hypertension  presented on 05/09/2019 with sudden onset of unsteady gait, nausea and vomiting.   Stroke work-up possible TIA versus small MRI negative posterior infarct likely secondary to small vessel disease source.  She did not receive IV t-PA due to late presentation (>4.5 hours from time of onset).  MRI no acute infarct with chronic right thalamic lacunar infarct.  CTA head/neck negative LVO or high-grade stenosis with age indeterminate right thalamic small vessel infarct likely subacute or chronic.  2D echo normal EF without cardiac source of embolus identified.  LDL 50.  A1c 7.3.  Recommended DAPT for 3 weeks then single agent alone.  HTN stable.  Continuation of atorvastatin 40 mg daily for HLD management.  Follow-up with PCP for DM management.  Other stroke risk factors include advanced age and history of stroke as evidenced on imaging.  Other active problems include multifactoral anemia likely both iron and B12 deficient with B12 IM injections initiated during admission.  She was discharged home in stable condition with recommendation of home health  therapy.  Angela Salinas is being seen today for hospital follow-up accompanied by her husband.  Since discharge, she has been experiencing intermittent episodes of vertigo but typically worse with head movement or position changes.  She also endorses increased fatigue.  She did recently have lab work by PCP and has follow-up visit next week.  She has continued on aspirin and Plavix despite 3-week DAPT duration but denies bleeding or bruising.  Currently taking Crestor for HLD management without myalgias.  Blood pressure today 111/56.  Denies new or worsening stroke/TIA symptoms.   ROS:   14 system review of systems performed and negative with exception of vertigo  PMH:  Past Medical History:  Diagnosis Date   Diabetes mellitus without complication (HCC)    High cholesterol     PSH:  Past Surgical History:  Procedure Laterality Date   NECK SURGERY      Social History:  Social History   Socioeconomic History   Marital status: Married    Spouse name: Not on file   Number of children: Not on file   Years of education: Not on file   Highest education level: Not on file  Occupational History   Not on file  Social Needs   Financial resource strain: Not on file   Food insecurity    Worry: Not on file    Inability: Not on file   Transportation needs    Medical: Not on file    Non-medical: Not on file  Tobacco Use   Smoking status: Never Smoker   Smokeless tobacco: Never Used  Substance and Sexual Activity   Alcohol  use: Never    Frequency: Never   Drug use: Never   Sexual activity: Not on file  Lifestyle   Physical activity    Days per week: Not on file    Minutes per session: Not on file   Stress: Not on file  Relationships   Social connections    Talks on phone: Not on file    Gets together: Not on file    Attends religious service: Not on file    Active member of club or organization: Not on file    Attends meetings of clubs or organizations:  Not on file    Relationship status: Not on file   Intimate partner violence    Fear of current or ex partner: Not on file    Emotionally abused: Not on file    Physically abused: Not on file    Forced sexual activity: Not on file  Other Topics Concern   Not on file  Social History Narrative   Not on file    Family History: History reviewed. No pertinent family history.  Medications:   Current Outpatient Medications on File Prior to Visit  Medication Sig Dispense Refill   aspirin EC 81 MG EC tablet Take 1 tablet (81 mg total) by mouth daily. 30 tablet 0   Carboxymethylcellul-Glycerin 1-0.25 % SOLN Place 1 drop into both eyes daily as needed (dry eyes).     clopidogrel (PLAVIX) 75 MG tablet Take 1 tablet (75 mg total) by mouth daily. 21 tablet 0   FARXIGA 10 MG TABS tablet Take 10 mg by mouth daily.     metFORMIN (GLUCOPHAGE-XR) 500 MG 24 hr tablet Take 500 mg by mouth 2 (two) times daily.     Multiple Vitamin (MULTIVITAMIN WITH MINERALS) TABS tablet Take 1 tablet by mouth daily.     NOVOLOG FLEXPEN 100 UNIT/ML FlexPen Inject 7-15 Units into the skin 3 (three) times daily before meals.     rosuvastatin (CRESTOR) 40 MG tablet Take 40 mg by mouth daily.     TRESIBA FLEXTOUCH 100 UNIT/ML SOPN FlexTouch Pen Inject 12 Units into the skin daily.     No current facility-administered medications on file prior to visit.     Allergies:   Allergies  Allergen Reactions   Codeine Other (See Comments)    lightheaded   Sulfa Antibiotics Other (See Comments)    Cannot recall     Physical Exam  Vitals:   05/26/19 1104  BP: (!) 111/56  Pulse: 74  Temp: 98.1 F (36.7 C)  TempSrc: Oral  Weight: 132 lb 12.8 oz (60.2 kg)  Height: 4\' 11"  (1.499 m)   Body mass index is 26.82 kg/m. No exam data present  No flowsheet data found.   General: well developed, well nourished,  pleasant elderly female, seated, in no evident distress Head: head normocephalic and atraumatic.     Neck: supple with no carotid or supraclavicular bruits Cardiovascular: regular rate and rhythm, no murmurs Musculoskeletal: no deformity Skin:  no rash/petichiae Vascular:  Normal pulses all extremities   Neurologic Exam Mental Status: Awake and fully alert. Oriented to place and time. Recent and remote memory intact. Attention span, concentration and fund of knowledge appropriate. Mood and affect appropriate.  Cranial Nerves: Fundoscopic exam reveals sharp disc margins. Pupils equal, briskly reactive to light. Extraocular movements full without nystagmus. Visual fields full to confrontation. Hearing intact. Facial sensation intact. Face, tongue, palate moves normally and symmetrically.  Motor: Normal bulk and tone. Normal strength in  all tested extremity muscles. Sensory.: intact to touch , pinprick , position and vibratory sensation.  Coordination: Rapid alternating movements normal in all extremities. Finger-to-nose and heel-to-shin performed accurately bilaterally. Gait and Station: Arises from chair without difficulty. Stance is normal. Gait demonstrates normal stride length with imbalance and increased difficulty turning.  Unable to perform tandem gait.  Romberg negative. Reflexes: 1+ and symmetric. Toes downgoing.     NIHSS  0 Modified Rankin  2    Diagnostic Data (Labs, Imaging, Testing)  Ct Angio Head W Or Wo Contrast Ct Angio Neck W Or Wo Contrast 05/09/2019 IMPRESSION:  1. No emergent large vessel occlusion or high-grade stenosis.  2. Age-indeterminate right thalamic small vessel infarct, likely subacute or chronic.  3. Aortic Atherosclerosis (ICD10-I70.0).   Mr Brain Wo Contrast 05/09/2019 IMPRESSION:  Thin-section axial, coronal and sagittal diffusion-weighted imaging performed through the region of the brainstem. No evidence of acute infarct within the pons.   Mr Brain Wo Contrast (neuro Protocol) 05/09/2019 IMPRESSION:  1. Two punctate foci of diffusion  weighted signal hyperintensity within the left pons are strongly favored to reflect image noise. Punctate acute infarcts cannot be definitively excluded.  2. Mild generalized parenchymal atrophy and chronic small vessel ischemic disease.  3. Chronic right thalamic lacunar infarct.  4. Small left mastoid effusion.   Dg Chest Port 1 View 05/09/2019 IMPRESSION:  Cardiomegaly without acute abnormality of the lungs in AP portable projection.  Transthoracic Echocardiogram  10/4//2020 Impression  1. Left ventricular ejection fraction, by visual estimation, is 55 to 60%. The left ventricle has normal function. Normal left ventricular size. There is mildly increased left ventricular hypertrophy.  2. Left ventricular diastolic Doppler parameters are consistent with pseudonormalization pattern of LV diastolic filling.  3. Global right ventricle has normal systolic function.The right ventricular size is normal. No increase in right ventricular wall thickness.  4. Left atrial size was normal.  5. Right atrial size was normal.  6. Trivial pericardial effusion is present.  7. The pericardial effusion is posterior to the left ventricle.  8. The mitral valve is grossly normal. Trace mitral valve regurgitation.  9. The tricuspid valve is grossly normal. Tricuspid valve regurgitation is trivial. 10. The aortic valve is tricuspid Aortic valve regurgitation was not visualized by color flow Doppler. Mild aortic valve sclerosis without stenosis. 11. The inferior vena cava is normal in size with greater than 50% respiratory variability, suggesting right atrial pressure of 3 mmHg. 12. The pulmonic valve was not well visualized. Pulmonic valve regurgitation is not visualized by color flow Doppler. 13. TR signal is inadequate for assessing pulmonary artery systolic pressure. 14. Presence of pericardial fat pad.   ECG - SR rate 82 BPM. (See cardiology reading for complete details)    ASSESSMENT: Angela  Salinas is a 68 y.o. year old female presented with sudden onset of unsteady gait and N/V on 05/09/2019 with stroke work-up possible TIA versus small MRI negative posterior infarct likely secondary to small vessel disease source. Vascular risk factors include HTN, HLD and DM.  Residual deficits of occasional vertigo and patient questioning potential participation in outpatient therapies    PLAN:  1. TIA versus small stroke: Continue aspirin 81 mg daily  and Crestor  for secondary stroke prevention.  Advised to discontinue Plavix and continue aspirin alone.  Maintain strict control of hypertension with blood pressure goal below 130/90, diabetes with hemoglobin A1c goal below 6.5% and cholesterol with LDL cholesterol (bad cholesterol) goal below 70 mg/dL.  I also advised  the patient to eat a healthy diet with plenty of whole grains, cereals, fruits and vegetables, exercise regularly with at least 30 minutes of continuous activity daily and maintain ideal body weight. 2. HTN: Advised to continue current treatment regimen. Advised to continue to monitor at home along with continued follow-up with PCP for management 3. HLD: Advised to continue current treatment regimen along with continued follow-up with PCP for future prescribing and monitoring of lipid panel 4. DMII: Advised to continue to monitor glucose levels at home along with continued follow-up with PCP for management and monitoring 5. Vertigo: Referral placed to neuro rehab    Follow up in 3 months or call earlier if needed   Greater than 50% of time during this 45 minute visit was spent on counseling, explanation of diagnosis of TIA versus small stroke, reviewing risk factor management of HTN, HLD and DM, planning of further management along with potential future management, and discussion with patient and family answering all questions.    Frann Rider, AGNP-BC  [facility] [address] [place],  [place] [address]  Phone [phone] Fax [phone] Note: This document was prepared with digital dictation and possible smart phrase technology. Any transcriptional errors that result from this process are unintentional.

## 2019-06-01 DIAGNOSIS — D509 Iron deficiency anemia, unspecified: Secondary | ICD-10-CM | POA: Diagnosis not present

## 2019-06-01 DIAGNOSIS — E782 Mixed hyperlipidemia: Secondary | ICD-10-CM | POA: Diagnosis not present

## 2019-06-01 DIAGNOSIS — E118 Type 2 diabetes mellitus with unspecified complications: Secondary | ICD-10-CM | POA: Diagnosis not present

## 2019-06-01 DIAGNOSIS — E11319 Type 2 diabetes mellitus with unspecified diabetic retinopathy without macular edema: Secondary | ICD-10-CM | POA: Diagnosis not present

## 2019-06-01 DIAGNOSIS — Z7189 Other specified counseling: Secondary | ICD-10-CM | POA: Diagnosis not present

## 2019-06-01 DIAGNOSIS — I10 Essential (primary) hypertension: Secondary | ICD-10-CM | POA: Diagnosis not present

## 2019-06-01 DIAGNOSIS — H8110 Benign paroxysmal vertigo, unspecified ear: Secondary | ICD-10-CM | POA: Diagnosis not present

## 2019-06-17 DIAGNOSIS — H8113 Benign paroxysmal vertigo, bilateral: Secondary | ICD-10-CM | POA: Diagnosis not present

## 2019-06-25 DIAGNOSIS — H8113 Benign paroxysmal vertigo, bilateral: Secondary | ICD-10-CM | POA: Diagnosis not present

## 2019-07-09 DIAGNOSIS — H8113 Benign paroxysmal vertigo, bilateral: Secondary | ICD-10-CM | POA: Diagnosis not present

## 2019-08-17 ENCOUNTER — Ambulatory Visit: Payer: Medicare Other | Attending: Internal Medicine

## 2019-08-17 DIAGNOSIS — E11319 Type 2 diabetes mellitus with unspecified diabetic retinopathy without macular edema: Secondary | ICD-10-CM | POA: Diagnosis not present

## 2019-08-17 DIAGNOSIS — E782 Mixed hyperlipidemia: Secondary | ICD-10-CM | POA: Diagnosis not present

## 2019-08-17 DIAGNOSIS — Z23 Encounter for immunization: Secondary | ICD-10-CM | POA: Insufficient documentation

## 2019-08-17 DIAGNOSIS — I10 Essential (primary) hypertension: Secondary | ICD-10-CM | POA: Diagnosis not present

## 2019-08-17 DIAGNOSIS — H8110 Benign paroxysmal vertigo, unspecified ear: Secondary | ICD-10-CM | POA: Diagnosis not present

## 2019-08-17 NOTE — Progress Notes (Signed)
   Covid-19 Vaccination Clinic  Name:  Joelisa Tobey    MRN: [id number] DOB: [birth year]  08/17/2019  Ms. Bunts was observed post Covid-19 immunization for 30 minutes based on pre-vaccination screening without incidence. She was provided with Vaccine Information Sheet and instruction to access the V-Safe system.   Ms. Garlow was instructed to call 911 with any severe reactions post vaccine: Marland Kitchen Difficulty breathing  . Swelling of your face and throat  . A fast heartbeat  . A bad rash all over your body  . Dizziness and weakness    Immunizations Administered    Name Date Dose VIS Date Route   Pfizer COVID-19 Vaccine 08/17/2019  9:58 AM 0.3 mL 07/17/2019 Intramuscular   Manufacturer: [place]   Lot: [id number]   [place]: [id number]

## 2019-08-24 DIAGNOSIS — D509 Iron deficiency anemia, unspecified: Secondary | ICD-10-CM | POA: Diagnosis not present

## 2019-08-24 DIAGNOSIS — E11319 Type 2 diabetes mellitus with unspecified diabetic retinopathy without macular edema: Secondary | ICD-10-CM | POA: Diagnosis not present

## 2019-08-24 DIAGNOSIS — I10 Essential (primary) hypertension: Secondary | ICD-10-CM | POA: Diagnosis not present

## 2019-08-24 DIAGNOSIS — E782 Mixed hyperlipidemia: Secondary | ICD-10-CM | POA: Diagnosis not present

## 2019-08-26 ENCOUNTER — Ambulatory Visit: Payer: Medicare Other | Admitting: Adult Health

## 2019-08-31 DIAGNOSIS — D509 Iron deficiency anemia, unspecified: Secondary | ICD-10-CM | POA: Diagnosis not present

## 2019-09-06 ENCOUNTER — Ambulatory Visit: Payer: Medicare Other | Attending: Internal Medicine

## 2019-09-06 DIAGNOSIS — Z23 Encounter for immunization: Secondary | ICD-10-CM | POA: Insufficient documentation

## 2019-09-06 NOTE — Progress Notes (Signed)
   Covid-19 Vaccination Clinic  Name:  Angela Salinas    MRN: [id number] DOB: 1950-10-25  09/06/2019  [name] was observed post Covid-19 immunization for 15 minutes without incidence. She was provided with Vaccine Information Sheet and instruction to access the V-Safe system.   [name] was instructed to call 911 with any severe reactions post vaccine: Marland Kitchen Difficulty breathing  . Swelling of your face and throat  . A fast heartbeat  . A bad rash all over your body  . Dizziness and weakness    Immunizations Administered    Name Date Dose VIS Date Route   Pfizer COVID-19 Vaccine 09/06/2019  9:50 AM 0.3 mL 07/17/2019 Intramuscular   Manufacturer: [place]   Lot: [id number]   [place]: [id number]

## 2019-09-22 DIAGNOSIS — D509 Iron deficiency anemia, unspecified: Secondary | ICD-10-CM | POA: Diagnosis not present

## 2019-09-22 DIAGNOSIS — E11319 Type 2 diabetes mellitus with unspecified diabetic retinopathy without macular edema: Secondary | ICD-10-CM | POA: Diagnosis not present

## 2019-09-22 DIAGNOSIS — E118 Type 2 diabetes mellitus with unspecified complications: Secondary | ICD-10-CM | POA: Diagnosis not present

## 2019-09-22 DIAGNOSIS — I10 Essential (primary) hypertension: Secondary | ICD-10-CM | POA: Diagnosis not present

## 2019-09-22 DIAGNOSIS — Z794 Long term (current) use of insulin: Secondary | ICD-10-CM | POA: Diagnosis not present

## 2019-09-22 DIAGNOSIS — E782 Mixed hyperlipidemia: Secondary | ICD-10-CM | POA: Diagnosis not present

## 2019-09-28 DIAGNOSIS — D509 Iron deficiency anemia, unspecified: Secondary | ICD-10-CM | POA: Diagnosis not present

## 2019-10-13 DIAGNOSIS — D509 Iron deficiency anemia, unspecified: Secondary | ICD-10-CM | POA: Diagnosis not present

## 2019-10-15 IMAGING — MR MR HEAD W/O CM
9 of 10 series · 35 of 48 positions shown · non-contrast
Comparison: No pertinent prior studies available for comparison

CLINICAL DATA: Ataxia, stroke suspected.

EXAM:
MRI HEAD WITHOUT CONTRAST
TECHNIQUE: Multiplanar, multiecho pulse sequences of the brain and surrounding
structures were obtained without intravenous contrast.

[Series 2: FLAIR · sagittal · 5.0mm · 0.47mm/px · 3 of 25 slices shown (1 of 2)]
[im 1/25]
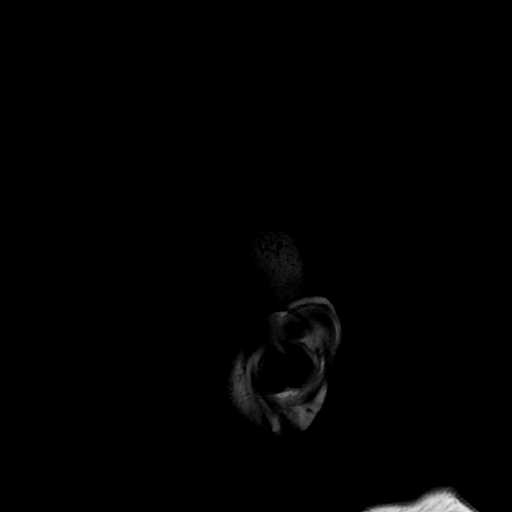
[im 13/25]
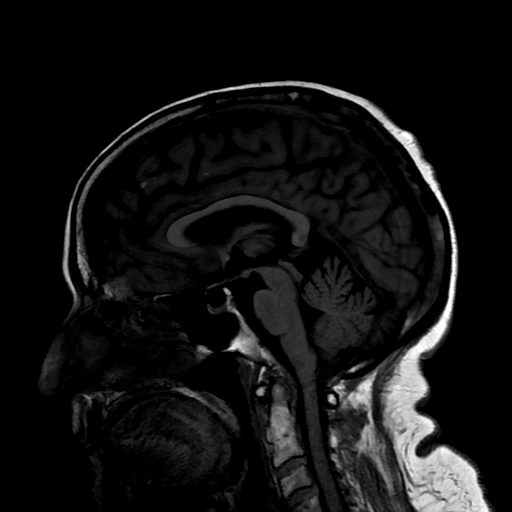
[im 25/25]
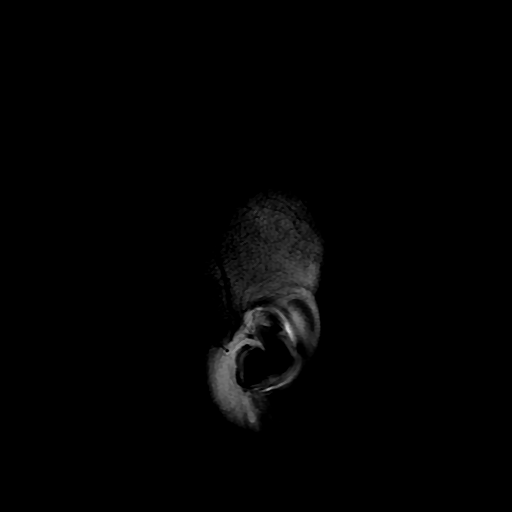

[Series 3: DWI · axial · 3.0mm · 0.94mm/px · z∈[-115,+30]mm · 9 of 100 slices shown (1 of 2)]
[im 1/100]
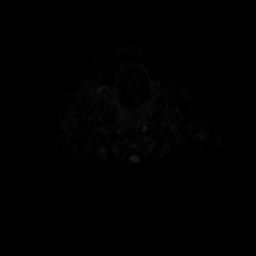
[im 13/100]
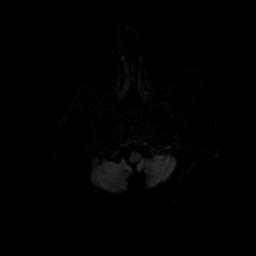
[im 25/100]
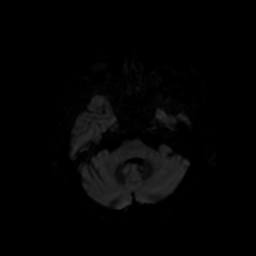
[im 38/100]
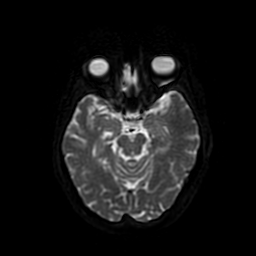
[im 50/100]
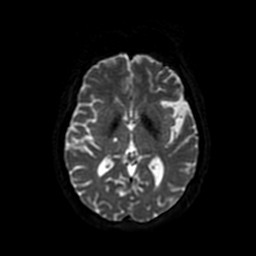
[im 62/100]
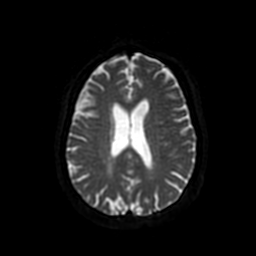
[im 75/100]
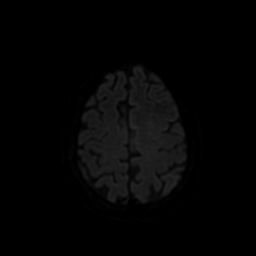
[im 87/100]
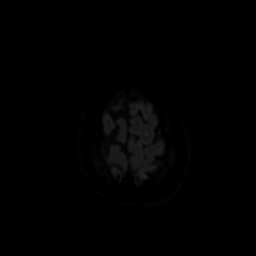
[im 100/100]
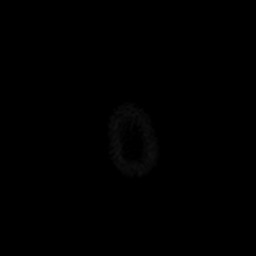

[Series 4: DWI · coronal · 4.0mm · 0.94mm/px · 7 of 72 slices shown (2 of 2)]
[im 1/72]
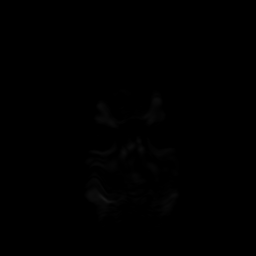
[im 12/72]
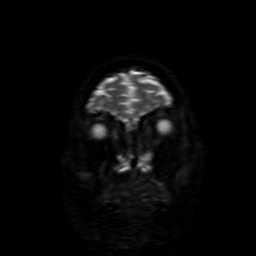
[im 24/72]
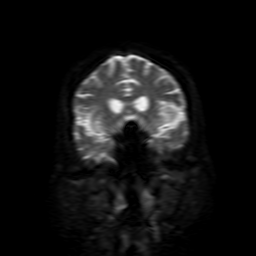
[im 36/72]
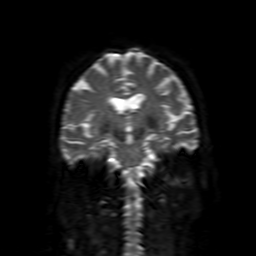
[im 48/72]
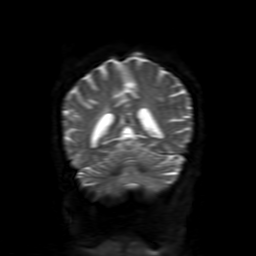
[im 60/72]
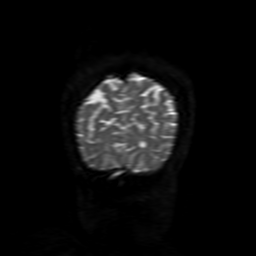
[im 72/72]
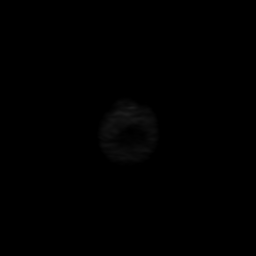

[Series 5: FLAIR · axial · 3.0mm · 0.45mm/px · z∈[-113,+29]mm · 2 of 25 slices shown (2 of 2)]
[im 1/25]
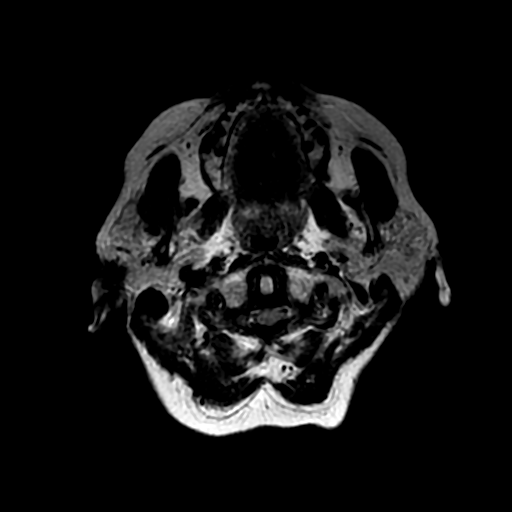
[im 25/25]
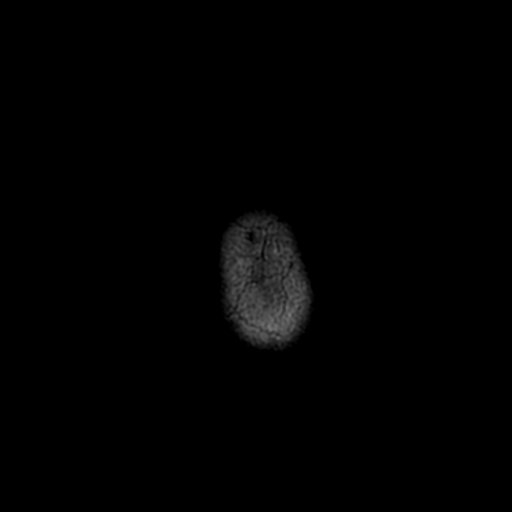

[Series 6: SWI · axial · 3.0mm · 0.45mm/px · 1 of 100 slices shown]
[im 1/100]
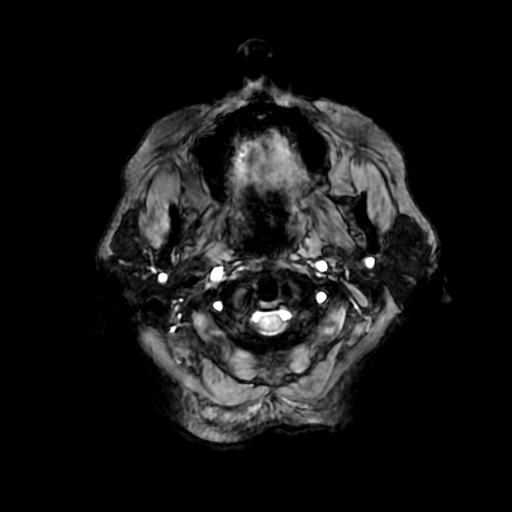

[Series 7: T2 · axial · 5.0mm · 0.47mm/px · z∈[-114,+28]mm · 2 of 25 slices shown (1 of 2)]
[im 1/25]
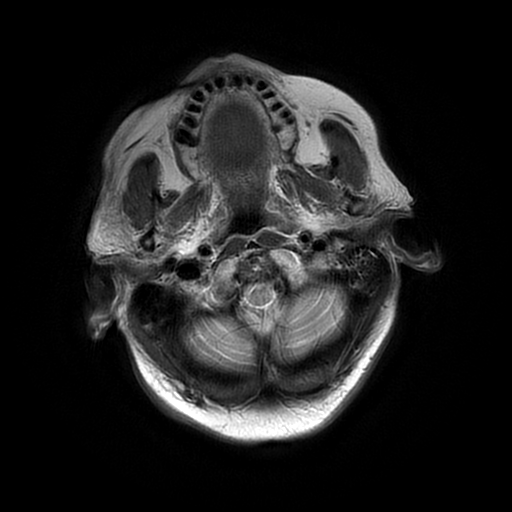
[im 25/25]
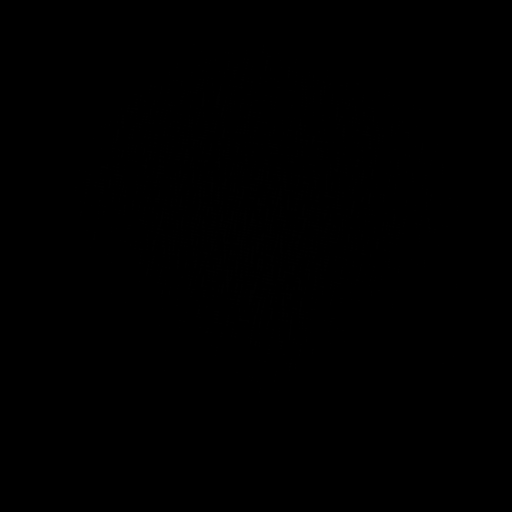

[Series 9: T2 · coronal · 5.0mm · 0.39mm/px · 3 of 30 slices shown (2 of 2)]
[im 1/30]
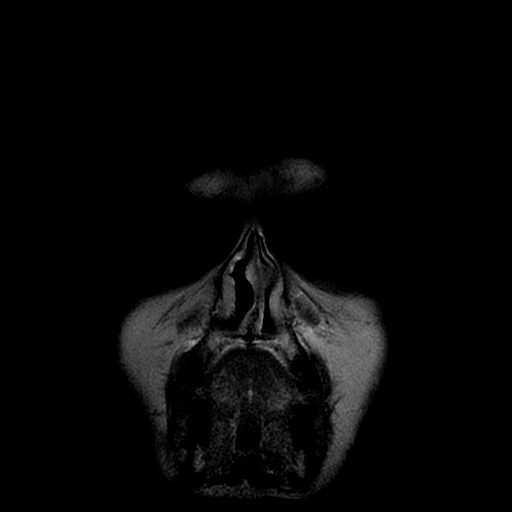
[im 15/30]
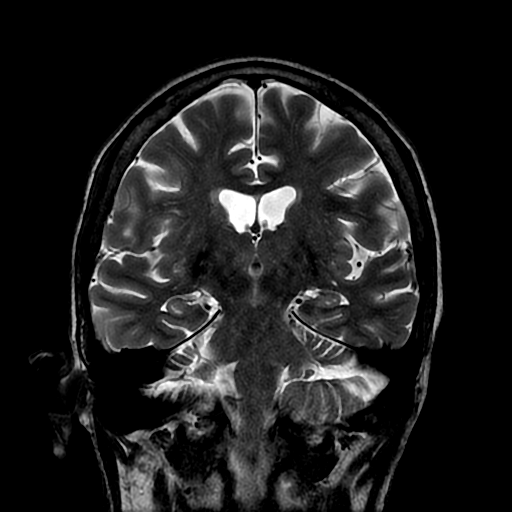
[im 30/30]
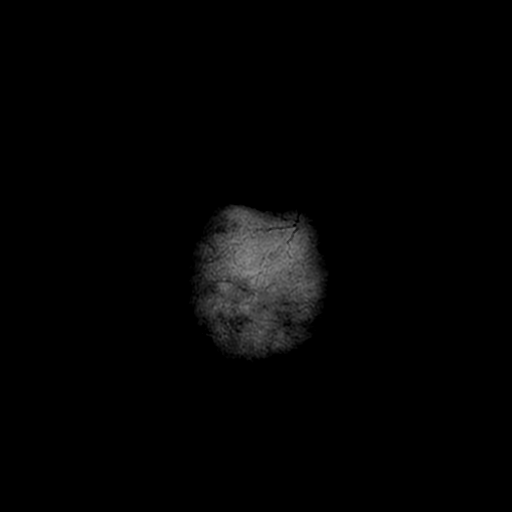

[Series 350: ADC · axial · 3.0mm · 0.94mm/px · z∈[-115,+30]mm · 5 of 50 slices shown (1 of 2)]
[im 1/50]
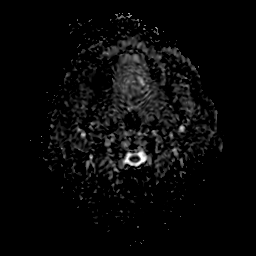
[im 13/50]
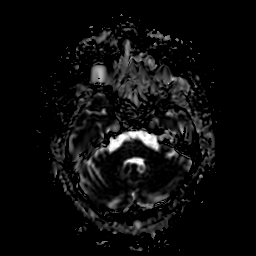
[im 25/50]
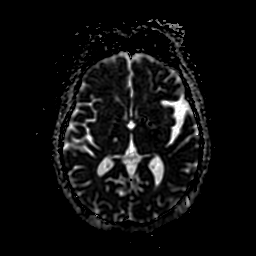
[im 37/50]
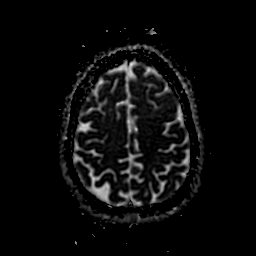
[im 50/50]
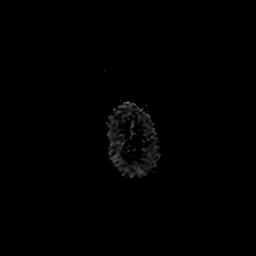

[Series 450: ADC · coronal · 4.0mm · 0.94mm/px · 3 of 36 slices shown (2 of 2)]
[im 1/36]
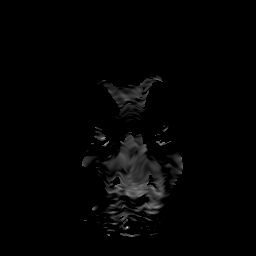
[im 18/36]
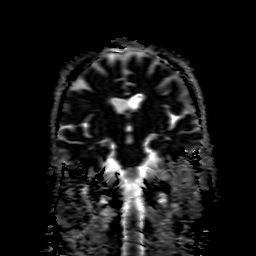
[im 36/36]
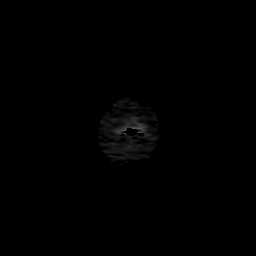

[35 of 48 positions shown; findings below may reference images not displayed]

FINDINGS: Brain:

Two punctate foci of diffusion weighted signal hyperintensity within
the left pons are strongly to favored to reflect image noise (series
3, image 15). These are seen on the axial diffusion-weighted imaging
only, and not on the coronal diffusion-weighted imaging. No evidence
of acute infarct elsewhere within the brain.

Mild scattered T2/FLAIR hyperintensity within the cerebral white
matter and brainstem is nonspecific, but consistent with chronic
small vessel ischemic disease. Chronic lacunar infarct within the
right thalamus.

No evidence of intracranial mass. No midline shift or extra-axial
fluid collection. No chronic intracranial blood products.

Mineralization within the bilateral basal ganglia and deep
cerebellar nuclei.

Mild generalized parenchymal atrophy.

Partially empty sella turcica.

Vascular: Flow voids maintained within the proximal large arterial
vessels.

Skull and upper cervical spine: No focal marrow lesion. Question
C3-C4 fusion.

Sinuses/Orbits: Visualized orbits demonstrate no acute abnormality.
No significant paranasal sinus disease or mastoid effusion.

These results were called by telephone at the time of interpretation
on 05/09/2019 at [DATE] to provider Dr. Fallon, who verbally
acknowledged these results.
IMPRESSION: 1. Two punctate foci of diffusion weighted signal hyperintensity
within the left pons are strongly favored to reflect image noise.
Punctate acute infarcts cannot be definitively excluded.
2. Mild generalized parenchymal atrophy and chronic small vessel
ischemic disease.
3. Chronic right thalamic lacunar infarct.
4. Small left mastoid effusion.

## 2019-10-20 DIAGNOSIS — E118 Type 2 diabetes mellitus with unspecified complications: Secondary | ICD-10-CM | POA: Diagnosis not present

## 2019-10-20 DIAGNOSIS — E782 Mixed hyperlipidemia: Secondary | ICD-10-CM | POA: Diagnosis not present

## 2019-10-20 DIAGNOSIS — I10 Essential (primary) hypertension: Secondary | ICD-10-CM | POA: Diagnosis not present

## 2019-10-20 DIAGNOSIS — D509 Iron deficiency anemia, unspecified: Secondary | ICD-10-CM | POA: Diagnosis not present

## 2019-10-26 DIAGNOSIS — Z1159 Encounter for screening for other viral diseases: Secondary | ICD-10-CM | POA: Diagnosis not present

## 2019-10-29 DIAGNOSIS — K317 Polyp of stomach and duodenum: Secondary | ICD-10-CM | POA: Diagnosis not present

## 2019-10-29 DIAGNOSIS — K3189 Other diseases of stomach and duodenum: Secondary | ICD-10-CM | POA: Diagnosis not present

## 2019-10-29 DIAGNOSIS — D123 Benign neoplasm of transverse colon: Secondary | ICD-10-CM | POA: Diagnosis not present

## 2019-10-29 DIAGNOSIS — K2930 Chronic superficial gastritis without bleeding: Secondary | ICD-10-CM | POA: Diagnosis not present

## 2019-10-29 DIAGNOSIS — D509 Iron deficiency anemia, unspecified: Secondary | ICD-10-CM | POA: Diagnosis not present

## 2019-10-29 DIAGNOSIS — B9681 Helicobacter pylori [H. pylori] as the cause of diseases classified elsewhere: Secondary | ICD-10-CM | POA: Diagnosis not present

## 2019-11-09 DIAGNOSIS — K317 Polyp of stomach and duodenum: Secondary | ICD-10-CM | POA: Diagnosis not present

## 2019-11-09 DIAGNOSIS — D123 Benign neoplasm of transverse colon: Secondary | ICD-10-CM | POA: Diagnosis not present

## 2019-11-09 DIAGNOSIS — B9681 Helicobacter pylori [H. pylori] as the cause of diseases classified elsewhere: Secondary | ICD-10-CM | POA: Diagnosis not present

## 2019-11-09 DIAGNOSIS — K2930 Chronic superficial gastritis without bleeding: Secondary | ICD-10-CM | POA: Diagnosis not present

## 2020-03-04 DIAGNOSIS — I10 Essential (primary) hypertension: Secondary | ICD-10-CM | POA: Diagnosis not present

## 2020-03-04 DIAGNOSIS — E782 Mixed hyperlipidemia: Secondary | ICD-10-CM | POA: Diagnosis not present

## 2020-03-04 DIAGNOSIS — E118 Type 2 diabetes mellitus with unspecified complications: Secondary | ICD-10-CM | POA: Diagnosis not present

## 2020-03-04 DIAGNOSIS — D509 Iron deficiency anemia, unspecified: Secondary | ICD-10-CM | POA: Diagnosis not present

## 2020-03-04 DIAGNOSIS — E538 Deficiency of other specified B group vitamins: Secondary | ICD-10-CM | POA: Diagnosis not present

## 2020-03-21 DIAGNOSIS — E782 Mixed hyperlipidemia: Secondary | ICD-10-CM | POA: Diagnosis not present

## 2020-03-21 DIAGNOSIS — I10 Essential (primary) hypertension: Secondary | ICD-10-CM | POA: Diagnosis not present

## 2020-03-21 DIAGNOSIS — E118 Type 2 diabetes mellitus with unspecified complications: Secondary | ICD-10-CM | POA: Diagnosis not present

## 2020-03-22 DIAGNOSIS — E782 Mixed hyperlipidemia: Secondary | ICD-10-CM | POA: Diagnosis not present

## 2020-03-22 DIAGNOSIS — G4700 Insomnia, unspecified: Secondary | ICD-10-CM | POA: Diagnosis not present

## 2020-03-22 DIAGNOSIS — E11319 Type 2 diabetes mellitus with unspecified diabetic retinopathy without macular edema: Secondary | ICD-10-CM | POA: Diagnosis not present

## 2020-03-22 DIAGNOSIS — E538 Deficiency of other specified B group vitamins: Secondary | ICD-10-CM | POA: Diagnosis not present

## 2020-03-22 DIAGNOSIS — Z0000 Encounter for general adult medical examination without abnormal findings: Secondary | ICD-10-CM | POA: Diagnosis not present

## 2020-03-22 DIAGNOSIS — I10 Essential (primary) hypertension: Secondary | ICD-10-CM | POA: Diagnosis not present

## 2020-03-22 DIAGNOSIS — Z634 Disappearance and death of family member: Secondary | ICD-10-CM | POA: Diagnosis not present

## 2020-04-21 DIAGNOSIS — Z23 Encounter for immunization: Secondary | ICD-10-CM | POA: Diagnosis not present

## 2020-04-29 DIAGNOSIS — Z23 Encounter for immunization: Secondary | ICD-10-CM | POA: Diagnosis not present

## 2020-07-12 DIAGNOSIS — E538 Deficiency of other specified B group vitamins: Secondary | ICD-10-CM | POA: Diagnosis not present

## 2020-07-12 DIAGNOSIS — E782 Mixed hyperlipidemia: Secondary | ICD-10-CM | POA: Diagnosis not present

## 2020-07-12 DIAGNOSIS — I10 Essential (primary) hypertension: Secondary | ICD-10-CM | POA: Diagnosis not present

## 2020-07-15 DIAGNOSIS — I10 Essential (primary) hypertension: Secondary | ICD-10-CM | POA: Diagnosis not present

## 2020-07-15 DIAGNOSIS — E782 Mixed hyperlipidemia: Secondary | ICD-10-CM | POA: Diagnosis not present

## 2020-07-15 DIAGNOSIS — E118 Type 2 diabetes mellitus with unspecified complications: Secondary | ICD-10-CM | POA: Diagnosis not present

## 2020-07-15 DIAGNOSIS — E538 Deficiency of other specified B group vitamins: Secondary | ICD-10-CM | POA: Diagnosis not present

## 2020-09-26 DIAGNOSIS — E118 Type 2 diabetes mellitus with unspecified complications: Secondary | ICD-10-CM | POA: Diagnosis not present

## 2020-10-17 DIAGNOSIS — T148XXA Other injury of unspecified body region, initial encounter: Secondary | ICD-10-CM | POA: Diagnosis not present

## 2020-10-17 DIAGNOSIS — W19XXXA Unspecified fall, initial encounter: Secondary | ICD-10-CM | POA: Diagnosis not present

## 2020-11-09 DIAGNOSIS — Z23 Encounter for immunization: Secondary | ICD-10-CM | POA: Diagnosis not present

## 2020-12-06 DIAGNOSIS — E538 Deficiency of other specified B group vitamins: Secondary | ICD-10-CM | POA: Diagnosis not present

## 2020-12-06 DIAGNOSIS — E782 Mixed hyperlipidemia: Secondary | ICD-10-CM | POA: Diagnosis not present

## 2020-12-06 DIAGNOSIS — I10 Essential (primary) hypertension: Secondary | ICD-10-CM | POA: Diagnosis not present

## 2020-12-07 DIAGNOSIS — E538 Deficiency of other specified B group vitamins: Secondary | ICD-10-CM | POA: Diagnosis not present

## 2020-12-07 DIAGNOSIS — I10 Essential (primary) hypertension: Secondary | ICD-10-CM | POA: Diagnosis not present

## 2020-12-07 DIAGNOSIS — E782 Mixed hyperlipidemia: Secondary | ICD-10-CM | POA: Diagnosis not present

## 2020-12-27 DIAGNOSIS — Z20822 Contact with and (suspected) exposure to covid-19: Secondary | ICD-10-CM | POA: Diagnosis not present

## 2021-01-17 DIAGNOSIS — E118 Type 2 diabetes mellitus with unspecified complications: Secondary | ICD-10-CM | POA: Diagnosis not present

## 2021-01-17 DIAGNOSIS — E782 Mixed hyperlipidemia: Secondary | ICD-10-CM | POA: Diagnosis not present

## 2021-01-17 DIAGNOSIS — I10 Essential (primary) hypertension: Secondary | ICD-10-CM | POA: Diagnosis not present

## 2021-05-05 DIAGNOSIS — Z23 Encounter for immunization: Secondary | ICD-10-CM | POA: Diagnosis not present

## 2021-05-25 DIAGNOSIS — I10 Essential (primary) hypertension: Secondary | ICD-10-CM | POA: Diagnosis not present

## 2021-05-25 DIAGNOSIS — E118 Type 2 diabetes mellitus with unspecified complications: Secondary | ICD-10-CM | POA: Diagnosis not present

## 2021-05-25 DIAGNOSIS — E782 Mixed hyperlipidemia: Secondary | ICD-10-CM | POA: Diagnosis not present

## 2021-05-25 DIAGNOSIS — E032 Hypothyroidism due to medicaments and other exogenous substances: Secondary | ICD-10-CM | POA: Diagnosis not present

## 2021-05-31 DIAGNOSIS — I10 Essential (primary) hypertension: Secondary | ICD-10-CM | POA: Diagnosis not present

## 2021-05-31 DIAGNOSIS — I700 Atherosclerosis of aorta: Secondary | ICD-10-CM | POA: Diagnosis not present

## 2021-05-31 DIAGNOSIS — E11319 Type 2 diabetes mellitus with unspecified diabetic retinopathy without macular edema: Secondary | ICD-10-CM | POA: Diagnosis not present

## 2021-05-31 DIAGNOSIS — N182 Chronic kidney disease, stage 2 (mild): Secondary | ICD-10-CM | POA: Diagnosis not present

## 2021-05-31 DIAGNOSIS — E782 Mixed hyperlipidemia: Secondary | ICD-10-CM | POA: Diagnosis not present

## 2021-05-31 DIAGNOSIS — E032 Hypothyroidism due to medicaments and other exogenous substances: Secondary | ICD-10-CM | POA: Diagnosis not present

## 2021-05-31 DIAGNOSIS — E538 Deficiency of other specified B group vitamins: Secondary | ICD-10-CM | POA: Diagnosis not present

## 2021-05-31 DIAGNOSIS — Z794 Long term (current) use of insulin: Secondary | ICD-10-CM | POA: Diagnosis not present

## 2021-05-31 DIAGNOSIS — Z0000 Encounter for general adult medical examination without abnormal findings: Secondary | ICD-10-CM | POA: Diagnosis not present

## 2021-06-01 DIAGNOSIS — E118 Type 2 diabetes mellitus with unspecified complications: Secondary | ICD-10-CM | POA: Diagnosis not present

## 2021-07-10 DIAGNOSIS — E118 Type 2 diabetes mellitus with unspecified complications: Secondary | ICD-10-CM | POA: Diagnosis not present

## 2021-07-15 DIAGNOSIS — Z23 Encounter for immunization: Secondary | ICD-10-CM | POA: Diagnosis not present

## 2021-07-19 DIAGNOSIS — M792 Neuralgia and neuritis, unspecified: Secondary | ICD-10-CM | POA: Diagnosis not present

## 2021-10-09 DIAGNOSIS — E1165 Type 2 diabetes mellitus with hyperglycemia: Secondary | ICD-10-CM | POA: Diagnosis not present

## 2021-12-13 DIAGNOSIS — N182 Chronic kidney disease, stage 2 (mild): Secondary | ICD-10-CM | POA: Diagnosis not present

## 2021-12-13 DIAGNOSIS — I10 Essential (primary) hypertension: Secondary | ICD-10-CM | POA: Diagnosis not present

## 2021-12-13 DIAGNOSIS — E11319 Type 2 diabetes mellitus with unspecified diabetic retinopathy without macular edema: Secondary | ICD-10-CM | POA: Diagnosis not present

## 2021-12-13 DIAGNOSIS — E782 Mixed hyperlipidemia: Secondary | ICD-10-CM | POA: Diagnosis not present

## 2021-12-28 DIAGNOSIS — E11319 Type 2 diabetes mellitus with unspecified diabetic retinopathy without macular edema: Secondary | ICD-10-CM | POA: Diagnosis not present

## 2021-12-28 DIAGNOSIS — E032 Hypothyroidism due to medicaments and other exogenous substances: Secondary | ICD-10-CM | POA: Diagnosis not present

## 2021-12-28 DIAGNOSIS — I10 Essential (primary) hypertension: Secondary | ICD-10-CM | POA: Diagnosis not present

## 2021-12-28 DIAGNOSIS — I700 Atherosclerosis of aorta: Secondary | ICD-10-CM | POA: Diagnosis not present

## 2021-12-28 DIAGNOSIS — E782 Mixed hyperlipidemia: Secondary | ICD-10-CM | POA: Diagnosis not present

## 2021-12-28 DIAGNOSIS — N182 Chronic kidney disease, stage 2 (mild): Secondary | ICD-10-CM | POA: Diagnosis not present

## 2021-12-28 DIAGNOSIS — E538 Deficiency of other specified B group vitamins: Secondary | ICD-10-CM | POA: Diagnosis not present

## 2021-12-28 DIAGNOSIS — Z794 Long term (current) use of insulin: Secondary | ICD-10-CM | POA: Diagnosis not present

## 2022-04-16 DIAGNOSIS — I10 Essential (primary) hypertension: Secondary | ICD-10-CM | POA: Diagnosis not present

## 2022-04-16 DIAGNOSIS — E118 Type 2 diabetes mellitus with unspecified complications: Secondary | ICD-10-CM | POA: Diagnosis not present

## 2022-04-16 DIAGNOSIS — E1165 Type 2 diabetes mellitus with hyperglycemia: Secondary | ICD-10-CM | POA: Diagnosis not present

## 2022-04-30 DIAGNOSIS — Z23 Encounter for immunization: Secondary | ICD-10-CM | POA: Diagnosis not present

## 2022-05-04 DIAGNOSIS — Z23 Encounter for immunization: Secondary | ICD-10-CM | POA: Diagnosis not present

## 2022-06-07 DIAGNOSIS — E032 Hypothyroidism due to medicaments and other exogenous substances: Secondary | ICD-10-CM | POA: Diagnosis not present

## 2022-06-07 DIAGNOSIS — Z0000 Encounter for general adult medical examination without abnormal findings: Secondary | ICD-10-CM | POA: Diagnosis not present

## 2022-06-07 DIAGNOSIS — I10 Essential (primary) hypertension: Secondary | ICD-10-CM | POA: Diagnosis not present

## 2022-06-07 DIAGNOSIS — N182 Chronic kidney disease, stage 2 (mild): Secondary | ICD-10-CM | POA: Diagnosis not present

## 2022-06-07 DIAGNOSIS — E782 Mixed hyperlipidemia: Secondary | ICD-10-CM | POA: Diagnosis not present

## 2022-06-07 DIAGNOSIS — Z794 Long term (current) use of insulin: Secondary | ICD-10-CM | POA: Diagnosis not present

## 2022-06-07 DIAGNOSIS — E11319 Type 2 diabetes mellitus with unspecified diabetic retinopathy without macular edema: Secondary | ICD-10-CM | POA: Diagnosis not present

## 2022-06-07 DIAGNOSIS — E538 Deficiency of other specified B group vitamins: Secondary | ICD-10-CM | POA: Diagnosis not present

## 2022-06-07 DIAGNOSIS — I700 Atherosclerosis of aorta: Secondary | ICD-10-CM | POA: Diagnosis not present

## 2022-06-14 DIAGNOSIS — I10 Essential (primary) hypertension: Secondary | ICD-10-CM | POA: Diagnosis not present

## 2022-06-14 DIAGNOSIS — L918 Other hypertrophic disorders of the skin: Secondary | ICD-10-CM | POA: Diagnosis not present

## 2022-06-14 DIAGNOSIS — E782 Mixed hyperlipidemia: Secondary | ICD-10-CM | POA: Diagnosis not present

## 2022-06-14 DIAGNOSIS — Z794 Long term (current) use of insulin: Secondary | ICD-10-CM | POA: Diagnosis not present

## 2022-06-14 DIAGNOSIS — E032 Hypothyroidism due to medicaments and other exogenous substances: Secondary | ICD-10-CM | POA: Diagnosis not present

## 2022-06-14 DIAGNOSIS — E11319 Type 2 diabetes mellitus with unspecified diabetic retinopathy without macular edema: Secondary | ICD-10-CM | POA: Diagnosis not present

## 2022-06-14 DIAGNOSIS — E538 Deficiency of other specified B group vitamins: Secondary | ICD-10-CM | POA: Diagnosis not present

## 2022-06-14 DIAGNOSIS — N1831 Chronic kidney disease, stage 3a: Secondary | ICD-10-CM | POA: Diagnosis not present

## 2022-06-14 DIAGNOSIS — R809 Proteinuria, unspecified: Secondary | ICD-10-CM | POA: Diagnosis not present

## 2022-06-14 DIAGNOSIS — I700 Atherosclerosis of aorta: Secondary | ICD-10-CM | POA: Diagnosis not present

## 2022-08-23 DIAGNOSIS — I10 Essential (primary) hypertension: Secondary | ICD-10-CM | POA: Diagnosis not present

## 2022-08-23 DIAGNOSIS — R809 Proteinuria, unspecified: Secondary | ICD-10-CM | POA: Diagnosis not present

## 2022-08-30 DIAGNOSIS — E032 Hypothyroidism due to medicaments and other exogenous substances: Secondary | ICD-10-CM | POA: Diagnosis not present

## 2022-08-30 DIAGNOSIS — E538 Deficiency of other specified B group vitamins: Secondary | ICD-10-CM | POA: Diagnosis not present

## 2022-08-30 DIAGNOSIS — I10 Essential (primary) hypertension: Secondary | ICD-10-CM | POA: Diagnosis not present

## 2022-08-30 DIAGNOSIS — N183 Chronic kidney disease, stage 3 unspecified: Secondary | ICD-10-CM | POA: Diagnosis not present

## 2022-08-30 DIAGNOSIS — R809 Proteinuria, unspecified: Secondary | ICD-10-CM | POA: Diagnosis not present

## 2022-08-30 DIAGNOSIS — Z794 Long term (current) use of insulin: Secondary | ICD-10-CM | POA: Diagnosis not present

## 2022-08-30 DIAGNOSIS — I700 Atherosclerosis of aorta: Secondary | ICD-10-CM | POA: Diagnosis not present

## 2022-08-30 DIAGNOSIS — E11319 Type 2 diabetes mellitus with unspecified diabetic retinopathy without macular edema: Secondary | ICD-10-CM | POA: Diagnosis not present

## 2022-08-30 DIAGNOSIS — E782 Mixed hyperlipidemia: Secondary | ICD-10-CM | POA: Diagnosis not present

## 2022-10-15 DIAGNOSIS — I700 Atherosclerosis of aorta: Secondary | ICD-10-CM | POA: Diagnosis not present

## 2022-10-15 DIAGNOSIS — Z794 Long term (current) use of insulin: Secondary | ICD-10-CM | POA: Diagnosis not present

## 2022-10-15 DIAGNOSIS — E11319 Type 2 diabetes mellitus with unspecified diabetic retinopathy without macular edema: Secondary | ICD-10-CM | POA: Diagnosis not present

## 2022-10-15 DIAGNOSIS — E032 Hypothyroidism due to medicaments and other exogenous substances: Secondary | ICD-10-CM | POA: Diagnosis not present

## 2022-10-15 DIAGNOSIS — N182 Chronic kidney disease, stage 2 (mild): Secondary | ICD-10-CM | POA: Diagnosis not present

## 2022-10-15 DIAGNOSIS — E538 Deficiency of other specified B group vitamins: Secondary | ICD-10-CM | POA: Diagnosis not present

## 2022-10-15 DIAGNOSIS — E118 Type 2 diabetes mellitus with unspecified complications: Secondary | ICD-10-CM | POA: Diagnosis not present

## 2022-10-15 DIAGNOSIS — E1165 Type 2 diabetes mellitus with hyperglycemia: Secondary | ICD-10-CM | POA: Diagnosis not present

## 2022-10-15 DIAGNOSIS — E782 Mixed hyperlipidemia: Secondary | ICD-10-CM | POA: Diagnosis not present

## 2022-10-15 DIAGNOSIS — I10 Essential (primary) hypertension: Secondary | ICD-10-CM | POA: Diagnosis not present

## 2022-11-22 DIAGNOSIS — N182 Chronic kidney disease, stage 2 (mild): Secondary | ICD-10-CM | POA: Diagnosis not present

## 2022-12-20 DIAGNOSIS — E11319 Type 2 diabetes mellitus with unspecified diabetic retinopathy without macular edema: Secondary | ICD-10-CM | POA: Diagnosis not present

## 2022-12-20 DIAGNOSIS — E782 Mixed hyperlipidemia: Secondary | ICD-10-CM | POA: Diagnosis not present

## 2022-12-20 DIAGNOSIS — I700 Atherosclerosis of aorta: Secondary | ICD-10-CM | POA: Diagnosis not present

## 2022-12-20 DIAGNOSIS — I10 Essential (primary) hypertension: Secondary | ICD-10-CM | POA: Diagnosis not present

## 2022-12-20 DIAGNOSIS — N182 Chronic kidney disease, stage 2 (mild): Secondary | ICD-10-CM | POA: Diagnosis not present

## 2022-12-20 DIAGNOSIS — Z794 Long term (current) use of insulin: Secondary | ICD-10-CM | POA: Diagnosis not present

## 2022-12-20 DIAGNOSIS — E538 Deficiency of other specified B group vitamins: Secondary | ICD-10-CM | POA: Diagnosis not present

## 2022-12-20 DIAGNOSIS — E1165 Type 2 diabetes mellitus with hyperglycemia: Secondary | ICD-10-CM | POA: Diagnosis not present

## 2022-12-20 DIAGNOSIS — E032 Hypothyroidism due to medicaments and other exogenous substances: Secondary | ICD-10-CM | POA: Diagnosis not present

## 2023-04-16 DIAGNOSIS — Z794 Long term (current) use of insulin: Secondary | ICD-10-CM | POA: Diagnosis not present

## 2023-04-16 DIAGNOSIS — I700 Atherosclerosis of aorta: Secondary | ICD-10-CM | POA: Diagnosis not present

## 2023-04-16 DIAGNOSIS — E1169 Type 2 diabetes mellitus with other specified complication: Secondary | ICD-10-CM | POA: Diagnosis not present

## 2023-04-16 DIAGNOSIS — E1122 Type 2 diabetes mellitus with diabetic chronic kidney disease: Secondary | ICD-10-CM | POA: Diagnosis not present

## 2023-04-16 DIAGNOSIS — Z23 Encounter for immunization: Secondary | ICD-10-CM | POA: Diagnosis not present

## 2023-04-16 DIAGNOSIS — E032 Hypothyroidism due to medicaments and other exogenous substances: Secondary | ICD-10-CM | POA: Diagnosis not present

## 2023-04-16 DIAGNOSIS — E1165 Type 2 diabetes mellitus with hyperglycemia: Secondary | ICD-10-CM | POA: Diagnosis not present

## 2023-04-16 DIAGNOSIS — N182 Chronic kidney disease, stage 2 (mild): Secondary | ICD-10-CM | POA: Diagnosis not present

## 2023-04-16 DIAGNOSIS — E782 Mixed hyperlipidemia: Secondary | ICD-10-CM | POA: Diagnosis not present

## 2023-04-16 DIAGNOSIS — I10 Essential (primary) hypertension: Secondary | ICD-10-CM | POA: Diagnosis not present

## 2023-04-16 DIAGNOSIS — E11319 Type 2 diabetes mellitus with unspecified diabetic retinopathy without macular edema: Secondary | ICD-10-CM | POA: Diagnosis not present

## 2023-05-02 DIAGNOSIS — Z23 Encounter for immunization: Secondary | ICD-10-CM | POA: Diagnosis not present

## 2023-06-11 DIAGNOSIS — H8110 Benign paroxysmal vertigo, unspecified ear: Secondary | ICD-10-CM | POA: Diagnosis not present

## 2023-06-11 DIAGNOSIS — Z0000 Encounter for general adult medical examination without abnormal findings: Secondary | ICD-10-CM | POA: Diagnosis not present

## 2023-06-11 DIAGNOSIS — D509 Iron deficiency anemia, unspecified: Secondary | ICD-10-CM | POA: Diagnosis not present

## 2023-06-11 DIAGNOSIS — E032 Hypothyroidism due to medicaments and other exogenous substances: Secondary | ICD-10-CM | POA: Diagnosis not present

## 2023-06-11 DIAGNOSIS — E118 Type 2 diabetes mellitus with unspecified complications: Secondary | ICD-10-CM | POA: Diagnosis not present

## 2023-06-11 DIAGNOSIS — E782 Mixed hyperlipidemia: Secondary | ICD-10-CM | POA: Diagnosis not present

## 2023-06-11 DIAGNOSIS — N182 Chronic kidney disease, stage 2 (mild): Secondary | ICD-10-CM | POA: Diagnosis not present

## 2023-06-11 DIAGNOSIS — I10 Essential (primary) hypertension: Secondary | ICD-10-CM | POA: Diagnosis not present

## 2023-06-13 DIAGNOSIS — H8113 Benign paroxysmal vertigo, bilateral: Secondary | ICD-10-CM | POA: Diagnosis not present

## 2023-06-18 DIAGNOSIS — E11319 Type 2 diabetes mellitus with unspecified diabetic retinopathy without macular edema: Secondary | ICD-10-CM | POA: Diagnosis not present

## 2023-06-18 DIAGNOSIS — E538 Deficiency of other specified B group vitamins: Secondary | ICD-10-CM | POA: Diagnosis not present

## 2023-06-18 DIAGNOSIS — E782 Mixed hyperlipidemia: Secondary | ICD-10-CM | POA: Diagnosis not present

## 2023-06-18 DIAGNOSIS — I700 Atherosclerosis of aorta: Secondary | ICD-10-CM | POA: Diagnosis not present

## 2023-06-18 DIAGNOSIS — Z794 Long term (current) use of insulin: Secondary | ICD-10-CM | POA: Diagnosis not present

## 2023-06-18 DIAGNOSIS — N1831 Chronic kidney disease, stage 3a: Secondary | ICD-10-CM | POA: Diagnosis not present

## 2023-06-18 DIAGNOSIS — E032 Hypothyroidism due to medicaments and other exogenous substances: Secondary | ICD-10-CM | POA: Diagnosis not present

## 2023-06-18 DIAGNOSIS — R748 Abnormal levels of other serum enzymes: Secondary | ICD-10-CM | POA: Diagnosis not present

## 2023-06-18 DIAGNOSIS — I10 Essential (primary) hypertension: Secondary | ICD-10-CM | POA: Diagnosis not present

## 2023-06-18 DIAGNOSIS — R809 Proteinuria, unspecified: Secondary | ICD-10-CM | POA: Diagnosis not present

## 2023-06-19 DIAGNOSIS — E032 Hypothyroidism due to medicaments and other exogenous substances: Secondary | ICD-10-CM | POA: Diagnosis not present

## 2023-06-19 DIAGNOSIS — E782 Mixed hyperlipidemia: Secondary | ICD-10-CM | POA: Diagnosis not present

## 2023-06-19 DIAGNOSIS — I700 Atherosclerosis of aorta: Secondary | ICD-10-CM | POA: Diagnosis not present

## 2023-06-19 DIAGNOSIS — I10 Essential (primary) hypertension: Secondary | ICD-10-CM | POA: Diagnosis not present

## 2023-06-19 DIAGNOSIS — E538 Deficiency of other specified B group vitamins: Secondary | ICD-10-CM | POA: Diagnosis not present

## 2023-06-19 DIAGNOSIS — Z794 Long term (current) use of insulin: Secondary | ICD-10-CM | POA: Diagnosis not present

## 2023-06-19 DIAGNOSIS — N182 Chronic kidney disease, stage 2 (mild): Secondary | ICD-10-CM | POA: Diagnosis not present

## 2023-06-19 DIAGNOSIS — E118 Type 2 diabetes mellitus with unspecified complications: Secondary | ICD-10-CM | POA: Diagnosis not present

## 2023-06-19 DIAGNOSIS — E11319 Type 2 diabetes mellitus with unspecified diabetic retinopathy without macular edema: Secondary | ICD-10-CM | POA: Diagnosis not present

## 2023-09-19 DIAGNOSIS — I10 Essential (primary) hypertension: Secondary | ICD-10-CM | POA: Diagnosis not present

## 2023-09-19 DIAGNOSIS — E782 Mixed hyperlipidemia: Secondary | ICD-10-CM | POA: Diagnosis not present

## 2023-09-19 DIAGNOSIS — E538 Deficiency of other specified B group vitamins: Secondary | ICD-10-CM | POA: Diagnosis not present

## 2023-09-19 DIAGNOSIS — E11319 Type 2 diabetes mellitus with unspecified diabetic retinopathy without macular edema: Secondary | ICD-10-CM | POA: Diagnosis not present

## 2023-09-19 DIAGNOSIS — N182 Chronic kidney disease, stage 2 (mild): Secondary | ICD-10-CM | POA: Diagnosis not present

## 2023-09-19 DIAGNOSIS — E032 Hypothyroidism due to medicaments and other exogenous substances: Secondary | ICD-10-CM | POA: Diagnosis not present

## 2023-09-19 DIAGNOSIS — Z794 Long term (current) use of insulin: Secondary | ICD-10-CM | POA: Diagnosis not present

## 2023-09-19 DIAGNOSIS — I700 Atherosclerosis of aorta: Secondary | ICD-10-CM | POA: Diagnosis not present

## 2023-09-30 DIAGNOSIS — Z794 Long term (current) use of insulin: Secondary | ICD-10-CM | POA: Diagnosis not present

## 2023-09-30 DIAGNOSIS — E782 Mixed hyperlipidemia: Secondary | ICD-10-CM | POA: Diagnosis not present

## 2023-09-30 DIAGNOSIS — E032 Hypothyroidism due to medicaments and other exogenous substances: Secondary | ICD-10-CM | POA: Diagnosis not present

## 2023-09-30 DIAGNOSIS — I700 Atherosclerosis of aorta: Secondary | ICD-10-CM | POA: Diagnosis not present

## 2023-09-30 DIAGNOSIS — E118 Type 2 diabetes mellitus with unspecified complications: Secondary | ICD-10-CM | POA: Diagnosis not present

## 2023-09-30 DIAGNOSIS — I10 Essential (primary) hypertension: Secondary | ICD-10-CM | POA: Diagnosis not present

## 2023-09-30 DIAGNOSIS — E11319 Type 2 diabetes mellitus with unspecified diabetic retinopathy without macular edema: Secondary | ICD-10-CM | POA: Diagnosis not present

## 2023-09-30 DIAGNOSIS — R1084 Generalized abdominal pain: Secondary | ICD-10-CM | POA: Diagnosis not present

## 2023-09-30 DIAGNOSIS — E538 Deficiency of other specified B group vitamins: Secondary | ICD-10-CM | POA: Diagnosis not present

## 2023-09-30 DIAGNOSIS — N182 Chronic kidney disease, stage 2 (mild): Secondary | ICD-10-CM | POA: Diagnosis not present

## 2023-10-08 DIAGNOSIS — R809 Proteinuria, unspecified: Secondary | ICD-10-CM | POA: Diagnosis not present

## 2023-10-08 DIAGNOSIS — I10 Essential (primary) hypertension: Secondary | ICD-10-CM | POA: Diagnosis not present

## 2023-10-08 DIAGNOSIS — R748 Abnormal levels of other serum enzymes: Secondary | ICD-10-CM | POA: Diagnosis not present

## 2023-10-08 DIAGNOSIS — E118 Type 2 diabetes mellitus with unspecified complications: Secondary | ICD-10-CM | POA: Diagnosis not present

## 2023-10-08 DIAGNOSIS — E782 Mixed hyperlipidemia: Secondary | ICD-10-CM | POA: Diagnosis not present

## 2023-10-15 DIAGNOSIS — R809 Proteinuria, unspecified: Secondary | ICD-10-CM | POA: Diagnosis not present

## 2023-10-15 DIAGNOSIS — I700 Atherosclerosis of aorta: Secondary | ICD-10-CM | POA: Diagnosis not present

## 2023-10-15 DIAGNOSIS — E11319 Type 2 diabetes mellitus with unspecified diabetic retinopathy without macular edema: Secondary | ICD-10-CM | POA: Diagnosis not present

## 2023-10-15 DIAGNOSIS — N183 Chronic kidney disease, stage 3 unspecified: Secondary | ICD-10-CM | POA: Diagnosis not present

## 2023-10-15 DIAGNOSIS — R748 Abnormal levels of other serum enzymes: Secondary | ICD-10-CM | POA: Diagnosis not present

## 2023-10-15 DIAGNOSIS — E538 Deficiency of other specified B group vitamins: Secondary | ICD-10-CM | POA: Diagnosis not present

## 2023-10-15 DIAGNOSIS — I10 Essential (primary) hypertension: Secondary | ICD-10-CM | POA: Diagnosis not present

## 2023-10-15 DIAGNOSIS — E032 Hypothyroidism due to medicaments and other exogenous substances: Secondary | ICD-10-CM | POA: Diagnosis not present

## 2023-10-15 DIAGNOSIS — Z794 Long term (current) use of insulin: Secondary | ICD-10-CM | POA: Diagnosis not present

## 2023-10-15 DIAGNOSIS — E782 Mixed hyperlipidemia: Secondary | ICD-10-CM | POA: Diagnosis not present

## 2023-10-16 DIAGNOSIS — R1084 Generalized abdominal pain: Secondary | ICD-10-CM | POA: Diagnosis not present

## 2024-01-27 DIAGNOSIS — E782 Mixed hyperlipidemia: Secondary | ICD-10-CM | POA: Diagnosis not present

## 2024-01-27 DIAGNOSIS — Z794 Long term (current) use of insulin: Secondary | ICD-10-CM | POA: Diagnosis not present

## 2024-01-27 DIAGNOSIS — E032 Hypothyroidism due to medicaments and other exogenous substances: Secondary | ICD-10-CM | POA: Diagnosis not present

## 2024-01-27 DIAGNOSIS — N182 Chronic kidney disease, stage 2 (mild): Secondary | ICD-10-CM | POA: Diagnosis not present

## 2024-01-27 DIAGNOSIS — I10 Essential (primary) hypertension: Secondary | ICD-10-CM | POA: Diagnosis not present

## 2024-01-27 DIAGNOSIS — E11319 Type 2 diabetes mellitus with unspecified diabetic retinopathy without macular edema: Secondary | ICD-10-CM | POA: Diagnosis not present

## 2024-01-27 DIAGNOSIS — I700 Atherosclerosis of aorta: Secondary | ICD-10-CM | POA: Diagnosis not present

## 2024-01-27 DIAGNOSIS — E538 Deficiency of other specified B group vitamins: Secondary | ICD-10-CM | POA: Diagnosis not present

## 2024-02-03 DIAGNOSIS — E782 Mixed hyperlipidemia: Secondary | ICD-10-CM | POA: Diagnosis not present

## 2024-02-03 DIAGNOSIS — I10 Essential (primary) hypertension: Secondary | ICD-10-CM | POA: Diagnosis not present

## 2024-02-03 DIAGNOSIS — R748 Abnormal levels of other serum enzymes: Secondary | ICD-10-CM | POA: Diagnosis not present

## 2024-02-03 DIAGNOSIS — E118 Type 2 diabetes mellitus with unspecified complications: Secondary | ICD-10-CM | POA: Diagnosis not present

## 2024-02-03 DIAGNOSIS — E032 Hypothyroidism due to medicaments and other exogenous substances: Secondary | ICD-10-CM | POA: Diagnosis not present

## 2024-02-05 DIAGNOSIS — R748 Abnormal levels of other serum enzymes: Secondary | ICD-10-CM | POA: Diagnosis not present

## 2024-02-05 DIAGNOSIS — R7989 Other specified abnormal findings of blood chemistry: Secondary | ICD-10-CM | POA: Diagnosis not present

## 2024-03-10 ENCOUNTER — Encounter (INDEPENDENT_AMBULATORY_CARE_PROVIDER_SITE_OTHER): Admitting: Ophthalmology

## 2024-03-10 DIAGNOSIS — Z794 Long term (current) use of insulin: Secondary | ICD-10-CM

## 2024-03-10 DIAGNOSIS — E113593 Type 2 diabetes mellitus with proliferative diabetic retinopathy without macular edema, bilateral: Secondary | ICD-10-CM | POA: Diagnosis not present

## 2024-03-10 DIAGNOSIS — H43813 Vitreous degeneration, bilateral: Secondary | ICD-10-CM

## 2024-03-18 ENCOUNTER — Encounter (INDEPENDENT_AMBULATORY_CARE_PROVIDER_SITE_OTHER): Admitting: Ophthalmology

## 2024-03-18 DIAGNOSIS — H26492 Other secondary cataract, left eye: Secondary | ICD-10-CM | POA: Diagnosis not present

## 2024-03-30 ENCOUNTER — Encounter (INDEPENDENT_AMBULATORY_CARE_PROVIDER_SITE_OTHER): Admitting: Ophthalmology

## 2024-03-30 DIAGNOSIS — Z961 Presence of intraocular lens: Secondary | ICD-10-CM

## 2024-03-30 DIAGNOSIS — E119 Type 2 diabetes mellitus without complications: Secondary | ICD-10-CM | POA: Diagnosis not present

## 2024-03-30 DIAGNOSIS — H5212 Myopia, left eye: Secondary | ICD-10-CM | POA: Diagnosis not present

## 2024-05-05 DIAGNOSIS — E782 Mixed hyperlipidemia: Secondary | ICD-10-CM | POA: Diagnosis not present

## 2024-05-05 DIAGNOSIS — E032 Hypothyroidism due to medicaments and other exogenous substances: Secondary | ICD-10-CM | POA: Diagnosis not present

## 2024-05-05 DIAGNOSIS — I10 Essential (primary) hypertension: Secondary | ICD-10-CM | POA: Diagnosis not present

## 2024-05-05 DIAGNOSIS — E118 Type 2 diabetes mellitus with unspecified complications: Secondary | ICD-10-CM | POA: Diagnosis not present

## 2024-05-29 DIAGNOSIS — Z23 Encounter for immunization: Secondary | ICD-10-CM | POA: Diagnosis not present

## 2024-06-29 DIAGNOSIS — E538 Deficiency of other specified B group vitamins: Secondary | ICD-10-CM | POA: Diagnosis not present

## 2024-06-29 DIAGNOSIS — E11319 Type 2 diabetes mellitus with unspecified diabetic retinopathy without macular edema: Secondary | ICD-10-CM | POA: Diagnosis not present

## 2024-06-29 DIAGNOSIS — Z794 Long term (current) use of insulin: Secondary | ICD-10-CM | POA: Diagnosis not present

## 2024-06-29 DIAGNOSIS — R809 Proteinuria, unspecified: Secondary | ICD-10-CM | POA: Diagnosis not present

## 2024-06-29 DIAGNOSIS — I10 Essential (primary) hypertension: Secondary | ICD-10-CM | POA: Diagnosis not present

## 2024-06-29 DIAGNOSIS — E782 Mixed hyperlipidemia: Secondary | ICD-10-CM | POA: Diagnosis not present

## 2024-06-29 DIAGNOSIS — E032 Hypothyroidism due to medicaments and other exogenous substances: Secondary | ICD-10-CM | POA: Diagnosis not present

## 2024-06-29 DIAGNOSIS — I700 Atherosclerosis of aorta: Secondary | ICD-10-CM | POA: Diagnosis not present

## 2024-06-29 DIAGNOSIS — R748 Abnormal levels of other serum enzymes: Secondary | ICD-10-CM | POA: Diagnosis not present

## 2024-07-07 DIAGNOSIS — E11319 Type 2 diabetes mellitus with unspecified diabetic retinopathy without macular edema: Secondary | ICD-10-CM | POA: Diagnosis not present

## 2024-07-07 DIAGNOSIS — I700 Atherosclerosis of aorta: Secondary | ICD-10-CM | POA: Diagnosis not present

## 2024-07-07 DIAGNOSIS — I10 Essential (primary) hypertension: Secondary | ICD-10-CM | POA: Diagnosis not present

## 2024-07-07 DIAGNOSIS — N183 Chronic kidney disease, stage 3 unspecified: Secondary | ICD-10-CM | POA: Diagnosis not present

## 2024-07-07 DIAGNOSIS — Z794 Long term (current) use of insulin: Secondary | ICD-10-CM | POA: Diagnosis not present

## 2024-07-07 DIAGNOSIS — R748 Abnormal levels of other serum enzymes: Secondary | ICD-10-CM | POA: Diagnosis not present

## 2024-07-07 DIAGNOSIS — R809 Proteinuria, unspecified: Secondary | ICD-10-CM | POA: Diagnosis not present

## 2024-07-07 DIAGNOSIS — Z0000 Encounter for general adult medical examination without abnormal findings: Secondary | ICD-10-CM | POA: Diagnosis not present

## 2024-07-07 DIAGNOSIS — E032 Hypothyroidism due to medicaments and other exogenous substances: Secondary | ICD-10-CM | POA: Diagnosis not present

## 2024-07-07 DIAGNOSIS — E538 Deficiency of other specified B group vitamins: Secondary | ICD-10-CM | POA: Diagnosis not present

## 2024-07-07 DIAGNOSIS — E782 Mixed hyperlipidemia: Secondary | ICD-10-CM | POA: Diagnosis not present

## 2024-07-07 DIAGNOSIS — Z23 Encounter for immunization: Secondary | ICD-10-CM | POA: Diagnosis not present

## 2025-03-29 ENCOUNTER — Encounter (INDEPENDENT_AMBULATORY_CARE_PROVIDER_SITE_OTHER): Admitting: Ophthalmology
